# Patient Record
Sex: Female | Born: 1935 | Race: White | Hispanic: No | Marital: Married | Smoking: Former smoker
Health system: Southern US, Community
[De-identification: ages and names within clinical notes are randomized; demographics above are authoritative.]

## PROBLEM LIST (undated history)

## (undated) DIAGNOSIS — S72002A Fracture of unspecified part of neck of left femur, initial encounter for closed fracture: Secondary | ICD-10-CM

## (undated) DIAGNOSIS — K5790 Diverticulosis of intestine, part unspecified, without perforation or abscess without bleeding: Secondary | ICD-10-CM

## (undated) DIAGNOSIS — T4145XA Adverse effect of unspecified anesthetic, initial encounter: Secondary | ICD-10-CM

## (undated) DIAGNOSIS — N390 Urinary tract infection, site not specified: Secondary | ICD-10-CM

## (undated) DIAGNOSIS — S72141A Displaced intertrochanteric fracture of right femur, initial encounter for closed fracture: Secondary | ICD-10-CM

## (undated) DIAGNOSIS — M810 Age-related osteoporosis without current pathological fracture: Secondary | ICD-10-CM

## (undated) DIAGNOSIS — K5900 Constipation, unspecified: Secondary | ICD-10-CM

## (undated) DIAGNOSIS — K219 Gastro-esophageal reflux disease without esophagitis: Secondary | ICD-10-CM

## (undated) DIAGNOSIS — F0390 Unspecified dementia without behavioral disturbance: Secondary | ICD-10-CM

## (undated) DIAGNOSIS — M1990 Unspecified osteoarthritis, unspecified site: Secondary | ICD-10-CM

## (undated) DIAGNOSIS — Z4789 Encounter for other orthopedic aftercare: Secondary | ICD-10-CM

## (undated) DIAGNOSIS — G20 Parkinson's disease: Secondary | ICD-10-CM

## (undated) DIAGNOSIS — T8859XA Other complications of anesthesia, initial encounter: Secondary | ICD-10-CM

## (undated) DIAGNOSIS — F0280 Dementia in other diseases classified elsewhere without behavioral disturbance: Secondary | ICD-10-CM

## (undated) DIAGNOSIS — M25559 Pain in unspecified hip: Secondary | ICD-10-CM

## (undated) DIAGNOSIS — G3183 Dementia with Lewy bodies: Secondary | ICD-10-CM

## (undated) HISTORY — PX: ABDOMINAL HYSTERECTOMY: SHX81

## (undated) HISTORY — DX: Constipation, unspecified: K59.00

## (undated) HISTORY — PX: TUBAL LIGATION: SHX77

## (undated) HISTORY — DX: Age-related osteoporosis without current pathological fracture: M81.0

## (undated) HISTORY — DX: Dementia with Lewy bodies: G31.83

## (undated) HISTORY — DX: Displaced intertrochanteric fracture of right femur, initial encounter for closed fracture: S72.141A

## (undated) HISTORY — PX: OTHER SURGICAL HISTORY: SHX169

## (undated) HISTORY — DX: Dementia in other diseases classified elsewhere without behavioral disturbance: F02.80

## (undated) HISTORY — DX: Urinary tract infection, site not specified: N39.0

## (undated) HISTORY — PX: EYE SURGERY: SHX253

## (undated) HISTORY — DX: Parkinson's disease: G20

## (undated) HISTORY — DX: Unspecified dementia without behavioral disturbance: F03.90

## (undated) HISTORY — DX: Fracture of unspecified part of neck of left femur, initial encounter for closed fracture: S72.002A

## (undated) HISTORY — DX: Pain in unspecified hip: M25.559

## (undated) HISTORY — DX: Encounter for other orthopedic aftercare: Z47.89

---

## 1997-11-07 ENCOUNTER — Ambulatory Visit (HOSPITAL_COMMUNITY): Admission: RE | Admit: 1997-11-07 | Discharge: 1997-11-07 | Payer: Self-pay | Admitting: Family Medicine

## 1998-11-22 ENCOUNTER — Encounter: Payer: Self-pay | Admitting: Family Medicine

## 1998-11-22 ENCOUNTER — Ambulatory Visit (HOSPITAL_COMMUNITY): Admission: RE | Admit: 1998-11-22 | Discharge: 1998-11-22 | Payer: Self-pay | Admitting: Family Medicine

## 1999-03-27 ENCOUNTER — Ambulatory Visit (HOSPITAL_COMMUNITY): Admission: RE | Admit: 1999-03-27 | Discharge: 1999-03-27 | Payer: Self-pay | Admitting: *Deleted

## 1999-11-25 ENCOUNTER — Ambulatory Visit (HOSPITAL_COMMUNITY): Admission: RE | Admit: 1999-11-25 | Discharge: 1999-11-25 | Payer: Self-pay | Admitting: Family Medicine

## 1999-11-25 ENCOUNTER — Encounter: Payer: Self-pay | Admitting: Family Medicine

## 2000-05-05 ENCOUNTER — Encounter: Admission: RE | Admit: 2000-05-05 | Discharge: 2000-05-05 | Payer: Self-pay | Admitting: Family Medicine

## 2000-05-05 ENCOUNTER — Encounter: Payer: Self-pay | Admitting: Family Medicine

## 2000-05-17 ENCOUNTER — Encounter: Admission: RE | Admit: 2000-05-17 | Discharge: 2000-06-01 | Payer: Self-pay | Admitting: Family Medicine

## 2000-12-01 ENCOUNTER — Ambulatory Visit (HOSPITAL_COMMUNITY): Admission: RE | Admit: 2000-12-01 | Discharge: 2000-12-01 | Payer: Self-pay | Admitting: Family Medicine

## 2000-12-01 ENCOUNTER — Encounter: Payer: Self-pay | Admitting: Family Medicine

## 2001-12-07 ENCOUNTER — Ambulatory Visit (HOSPITAL_COMMUNITY): Admission: RE | Admit: 2001-12-07 | Discharge: 2001-12-07 | Payer: Self-pay | Admitting: Family Medicine

## 2001-12-07 ENCOUNTER — Encounter: Payer: Self-pay | Admitting: Family Medicine

## 2002-04-07 ENCOUNTER — Ambulatory Visit (HOSPITAL_COMMUNITY): Admission: RE | Admit: 2002-04-07 | Discharge: 2002-04-07 | Payer: Self-pay | Admitting: *Deleted

## 2002-07-13 ENCOUNTER — Encounter: Payer: Self-pay | Admitting: Family Medicine

## 2002-07-13 ENCOUNTER — Encounter: Admission: RE | Admit: 2002-07-13 | Discharge: 2002-07-13 | Payer: Self-pay | Admitting: Family Medicine

## 2002-12-13 ENCOUNTER — Ambulatory Visit (HOSPITAL_COMMUNITY): Admission: RE | Admit: 2002-12-13 | Discharge: 2002-12-13 | Payer: Self-pay | Admitting: Family Medicine

## 2002-12-13 ENCOUNTER — Encounter: Payer: Self-pay | Admitting: Family Medicine

## 2003-03-15 ENCOUNTER — Ambulatory Visit (HOSPITAL_COMMUNITY): Admission: RE | Admit: 2003-03-15 | Discharge: 2003-03-15 | Payer: Self-pay | Admitting: Family Medicine

## 2003-03-15 ENCOUNTER — Encounter: Payer: Self-pay | Admitting: Family Medicine

## 2003-11-20 ENCOUNTER — Emergency Department (HOSPITAL_COMMUNITY): Admission: EM | Admit: 2003-11-20 | Discharge: 2003-11-21 | Payer: Self-pay | Admitting: Emergency Medicine

## 2003-12-19 ENCOUNTER — Ambulatory Visit (HOSPITAL_COMMUNITY): Admission: RE | Admit: 2003-12-19 | Discharge: 2003-12-19 | Payer: Self-pay | Admitting: Family Medicine

## 2004-03-13 ENCOUNTER — Encounter: Admission: RE | Admit: 2004-03-13 | Discharge: 2004-03-13 | Payer: Self-pay | Admitting: Family Medicine

## 2004-12-31 ENCOUNTER — Ambulatory Visit (HOSPITAL_COMMUNITY): Admission: RE | Admit: 2004-12-31 | Discharge: 2004-12-31 | Payer: Self-pay | Admitting: Family Medicine

## 2005-05-27 ENCOUNTER — Encounter: Admission: RE | Admit: 2005-05-27 | Discharge: 2005-05-27 | Payer: Self-pay | Admitting: Family Medicine

## 2006-01-13 ENCOUNTER — Ambulatory Visit (HOSPITAL_COMMUNITY): Admission: RE | Admit: 2006-01-13 | Discharge: 2006-01-13 | Payer: Self-pay | Admitting: Family Medicine

## 2006-01-21 ENCOUNTER — Encounter: Admission: RE | Admit: 2006-01-21 | Discharge: 2006-01-21 | Payer: Self-pay | Admitting: Family Medicine

## 2007-01-11 ENCOUNTER — Ambulatory Visit (HOSPITAL_COMMUNITY): Admission: RE | Admit: 2007-01-11 | Discharge: 2007-01-12 | Payer: Self-pay | Admitting: Obstetrics and Gynecology

## 2007-02-01 ENCOUNTER — Ambulatory Visit (HOSPITAL_COMMUNITY): Admission: RE | Admit: 2007-02-01 | Discharge: 2007-02-01 | Payer: Self-pay | Admitting: Family Medicine

## 2008-02-07 ENCOUNTER — Ambulatory Visit (HOSPITAL_COMMUNITY): Admission: RE | Admit: 2008-02-07 | Discharge: 2008-02-07 | Payer: Self-pay | Admitting: Family Medicine

## 2008-02-10 ENCOUNTER — Encounter: Admission: RE | Admit: 2008-02-10 | Discharge: 2008-02-10 | Payer: Self-pay | Admitting: Neurology

## 2009-02-07 ENCOUNTER — Ambulatory Visit (HOSPITAL_COMMUNITY): Admission: RE | Admit: 2009-02-07 | Discharge: 2009-02-07 | Payer: Self-pay | Admitting: Family Medicine

## 2010-01-01 ENCOUNTER — Encounter: Admission: RE | Admit: 2010-01-01 | Discharge: 2010-01-01 | Payer: Self-pay | Admitting: Dermatology

## 2010-01-16 ENCOUNTER — Encounter: Admission: RE | Admit: 2010-01-16 | Discharge: 2010-01-16 | Payer: Self-pay | Admitting: Dermatology

## 2010-02-26 ENCOUNTER — Ambulatory Visit (HOSPITAL_COMMUNITY): Admission: RE | Admit: 2010-02-26 | Discharge: 2010-02-26 | Payer: Self-pay | Admitting: General Surgery

## 2010-02-28 ENCOUNTER — Ambulatory Visit: Payer: Self-pay | Admitting: Oncology

## 2010-03-12 ENCOUNTER — Ambulatory Visit: Admission: RE | Admit: 2010-03-12 | Discharge: 2010-03-28 | Payer: Self-pay | Admitting: Radiation Oncology

## 2010-09-18 LAB — COMPREHENSIVE METABOLIC PANEL
ALT: 18 U/L (ref 0–35)
AST: 27 U/L (ref 0–37)
Albumin: 3.8 g/dL (ref 3.5–5.2)
BUN: 18 mg/dL (ref 6–23)
Chloride: 106 mEq/L (ref 96–112)
GFR calc Af Amer: >60 mL/min (ref >60)
GFR calc non Af Amer: >60 mL/min (ref >60)
Potassium: 4.6 mEq/L (ref 3.5–5.1)
Total Bilirubin: 0.4 mg/dL (ref 0.3–1.2)
Total Protein: 6.8 g/dL (ref 6.0–8.3)

## 2010-09-18 LAB — DIFFERENTIAL
Basophils Relative: 0 % (ref 0–1)
Eosinophils Absolute: 0.2 10*3/uL (ref 0.0–0.7)
Eosinophils Relative: 3 % (ref 0–5)
Lymphocytes Relative: 36 % (ref 12–46)
Lymphs Abs: 2.5 10*3/uL (ref 0.7–4.0)
Monocytes Absolute: 0.4 10*3/uL (ref 0.1–1.0)

## 2010-09-18 LAB — CBC
Hemoglobin: 14.7 g/dL (ref 12.0–15.0)
MCHC: 34.5 g/dL (ref 30.0–36.0)
Platelets: 200 10*3/uL (ref 150–400)

## 2010-09-18 LAB — SURGICAL PCR SCREEN: Staphylococcus aureus: NEGATIVE

## 2010-10-24 ENCOUNTER — Other Ambulatory Visit (HOSPITAL_COMMUNITY): Payer: Self-pay | Admitting: General Surgery

## 2010-10-24 DIAGNOSIS — Z0000 Encounter for general adult medical examination without abnormal findings: Secondary | ICD-10-CM

## 2010-11-18 NOTE — Op Note (Signed)
NAMEBEAU, Deborah Hunt                ACCOUNT NO.:  [id number]   MEDICAL RECORD NO.:  [id number]          PATIENT TYPE:  AMB   LOCATION:  SDC                           FACILITY:  [facility]   PHYSICIAN:  Randye Lobo, M.D.   DATE OF BIRTH:  11/21/1926   DATE OF PROCEDURE:  01/11/2007  DATE OF DISCHARGE:                               OPERATIVE REPORT   PREOPERATIVE DIAGNOSIS:  Third degree rectocele.   POSTOPERATIVE DIAGNOSIS:  Third degree rectocele.   PROCEDURES:  A posterior colporrhaphy.   SURGEON:  Conley Simmonds, M.D.   ASSISTANT:  Lodema Hong, M.D.   ANESTHESIA:  General endotracheal, local with 0.25% Marcaine with  epinephrine 1:100,000.   IV FLUIDS:  1000 mL Ringer's lactate.   ESTIMATED BLOOD LOSS:  Minimal.   URINE OUTPUT:  100 mL   COMPLICATIONS:  None.   INDICATIONS FOR PROCEDURE:  The patient is a 77 year old gravida 2, para  2 Caucasian female status post total abdominal hysterectomy with  bilateral salpingo-oophorectomy, who was referred by Dr. Manus Gunning for  evaluation and treatment of the rectocele.  The patient is reporting  constipation symptoms.  She does not require perineal splinting or  vaginal digital pressure for bowel movements.  She reports discomfort  secondary to the rectocele.  She denies any history of urinary  incontinence with stressful maneuvers.  She does have a history of urge  incontinence and microscopic hematuria which has been evaluated by Dr.  Barron Alvine and this documented a possible small renal stone, but  otherwise negative bladder cytology and a negative cystoscopy.   The patient presents now for surgical treatment of the third degree  rectocele after risks, benefits, and alternatives are reviewed.   FINDINGS:  Exam under anesthesia revealed a third-degree high rectocele.  There is good anterior vaginal wall and good vaginal apical support.  The cervix and uterus were noted to be absent.  No midline or adnexal  masses were  appreciated.   PROCEDURE:  The patient is reidentified in the preoperative hold area.  She did receive Ancef 1 gram IV for antibiotic prophylaxis.  She  received both TED hose and PAS stockings for DVT prophylaxis.   In the operating room, general endotracheal anesthesia was induced and  the patient was then placed in the dorsal lithotomy position.  The lower  abdomen and vagina and perineum were sterilely prepped and draped.  The  Foley catheter was sterilely placed inside the bladder.   An exam under anesthesia was performed and the findings are as noted  above.   Allis clamps were used to mark the perineal body in the posterior  vaginal wall in the midline.  The vaginal mucosa and perineal body was  then injected with quarter percent Marcaine with 1:200,000 of  epinephrine.  A triangular wedge of epithelium was excised from the  perineal body and the posterior vaginal wall was incised vertically with  a Metzenbaum scissors.  The perirectal fascia was dissected off of the  overlying vaginal epithelium bilaterally all the way to the level of the  vaginal apex.  A digital  rectal examination was performed at this time  and the area above the rectocele was explored for an enterocele.  There  was no enterocele sac appreciated.   The posterior colporrhaphy was performed with a combination of 2-0  Vicryl and 0 Vicryl sutures.  The top suture of the posterior  colporrhaphy was a pursestring suture of 2-0 Vicryl which was performed  while a gloved digit remained in the rectum.  Reduction of the rectocele  was then performed by placing a vertical mattress sutures of 2-0 Vicryl  which transitioned down to 0 Vicryl at the level of the perineal body.  An additional mattress suture of 2-0 Vicryl was placed at the very top  of the posterior colporrhaphy in order to imbricate the pursestring  suture and provide additional support at the vaginal apex.  Excess  vaginal mucosa was then trimmed and  the posterior vaginal mucosa was  closed with a running locked suture of 2-0 Vicryl which continued along  the perineal body in a running fashion and then to close the perineum  with a subcuticular closure, as for an episiotomy.   A vaginal packing with Estrace cream was placed in the vagina.  The  Foley catheter was left to gravity drainage.  Final rectal exam  confirmed the absence of sutures in the rectum.   This concluded the patient's procedure.  There were no complications.  All needle, instrument, sponge counts were correct.      Randye Lobo, M.D.  Electronically Signed     BES/MEDQ  D:  01/11/2007  T:  01/11/2007  Job:  [id number]   cc:   Bryan Lemma. Manus Gunning, M.D.  Fax: [phone]

## 2011-02-09 ENCOUNTER — Other Ambulatory Visit (INDEPENDENT_AMBULATORY_CARE_PROVIDER_SITE_OTHER): Payer: Self-pay | Admitting: General Surgery

## 2011-02-09 ENCOUNTER — Ambulatory Visit (HOSPITAL_COMMUNITY)
Admission: RE | Admit: 2011-02-09 | Discharge: 2011-02-09 | Disposition: A | Discharge status: Home / Self Care | Payer: Medicare Other | Source: Ambulatory Visit | Attending: General Surgery | Admitting: General Surgery

## 2011-02-09 DIAGNOSIS — Z1231 Encounter for screening mammogram for malignant neoplasm of breast: Secondary | ICD-10-CM

## 2011-02-09 DIAGNOSIS — Z0000 Encounter for general adult medical examination without abnormal findings: Secondary | ICD-10-CM

## 2011-02-13 ENCOUNTER — Ambulatory Visit
Admission: RE | Admit: 2011-02-13 | Discharge: 2011-02-13 | Disposition: A | Discharge status: Home / Self Care | Payer: Medicare Other | Source: Ambulatory Visit | Attending: General Surgery | Admitting: General Surgery

## 2011-02-13 DIAGNOSIS — Z1231 Encounter for screening mammogram for malignant neoplasm of breast: Secondary | ICD-10-CM

## 2011-04-21 LAB — CBC
Hemoglobin: 11.9 — ABNORMAL LOW
MCHC: 33.4
MCHC: 34.7
MCV: 91.3
Platelets: 233
RBC: 3.78 — ABNORMAL LOW
RBC: 4.59
RDW: 13
WBC: 12.1 — ABNORMAL HIGH

## 2011-04-21 LAB — URINE MICROSCOPIC-ADD ON

## 2011-04-21 LAB — URINALYSIS, ROUTINE W REFLEX MICROSCOPIC
Bilirubin Urine: NEGATIVE
Glucose, UA: NEGATIVE
Ketones, ur: NEGATIVE
Nitrite: NEGATIVE
Specific Gravity, Urine: 1.02
pH: 6.5

## 2011-04-21 LAB — COMPREHENSIVE METABOLIC PANEL
ALT: 14
AST: 25
Calcium: 9.4
Creatinine, Ser: 0.64
GFR calc Af Amer: >60
GFR calc non Af Amer: >60
Sodium: 141
Total Protein: 6.9

## 2011-04-21 LAB — BASIC METABOLIC PANEL
CO2: 28
Calcium: 8.3 — ABNORMAL LOW
Chloride: 108
Creatinine, Ser: 0.64
GFR calc Af Amer: >60
Sodium: 140

## 2011-07-07 DIAGNOSIS — F0280 Dementia in other diseases classified elsewhere without behavioral disturbance: Secondary | ICD-10-CM

## 2011-07-07 DIAGNOSIS — K5900 Constipation, unspecified: Secondary | ICD-10-CM

## 2011-07-07 HISTORY — DX: Constipation, unspecified: K59.00

## 2011-07-07 HISTORY — DX: Dementia in other diseases classified elsewhere, unspecified severity, without behavioral disturbance, psychotic disturbance, mood disturbance, and anxiety: F02.80

## 2012-02-11 ENCOUNTER — Ambulatory Visit (HOSPITAL_COMMUNITY)
Admission: RE | Admit: 2012-02-11 | Discharge: 2012-02-11 | Disposition: A | Discharge status: Home / Self Care | Payer: Medicare Other | Source: Ambulatory Visit | Attending: Family Medicine | Admitting: Family Medicine

## 2012-02-11 DIAGNOSIS — R1311 Dysphagia, oral phase: Secondary | ICD-10-CM | POA: Insufficient documentation

## 2012-02-11 DIAGNOSIS — K219 Gastro-esophageal reflux disease without esophagitis: Secondary | ICD-10-CM

## 2012-02-11 DIAGNOSIS — R531 Weakness: Secondary | ICD-10-CM

## 2012-02-11 DIAGNOSIS — R5383 Other fatigue: Secondary | ICD-10-CM | POA: Insufficient documentation

## 2012-02-11 DIAGNOSIS — R5381 Other malaise: Secondary | ICD-10-CM | POA: Insufficient documentation

## 2012-02-11 NOTE — Procedures (Signed)
Objective Swallowing Evaluation: Modified Barium Swallowing Study  Patient Details  Name: Deborah Hunt MRN: [id number] Date of Birth: 1926-07-14  Today's Date: 02/11/2012 Time: 1135-1210 SLP Time Calculation (min): 35 min  Past Medical History: No past medical history on file. Past Surgical History: No past surgical history on file. HPI:  78 yr old seen for outpatient MBS accompanied by husband.  Pt.'s husband reported pt. coughing frequently with water, sometimes with pills, not frequently during meals.  No recent pna or acute health problems although he reports a steady decline in mobility and energy level.  HPMH:  cervical spondylosis at C4-5, C5-6.  Pt./spouse deny esophageal impairments.      Assessment / Plan / Recommendation Clinical Impression  Dysphagia Diagnosis: Mild oral phase dysphagia Clinical impression: Pt. exhibited mild oral dysphagia characterized by decreased oral control and coordination with attempts to transit barium pill with thin barium resulting in premature spill of barium into laryngeal vestibule with strong cough.  Swallow initiation with consistencies was WFL's as well as tongue base retraction and laryngeal elevation.  Pt. would be at higher aspiration risk with foods that have varying textures (fruit cocktail, vegetable soup etc) or pills with thin liquid.  Recommend regular texture (moist meats, avoid dry and crumbly foods) and thin liquids, pills with puree texture.      Treatment Recommendation       Diet Recommendation Regular;Thin liquid   Liquid Administration via: Cup;No straw Medication Administration: Whole meds with puree Supervision: Full supervision/cueing for compensatory strategies;Patient able to self feed Compensations: Slow rate;Small sips/bites Postural Changes and/or Swallow Maneuvers: Seated upright 90 degrees    Other  Recommendations Oral Care Recommendations: Oral care BID   Follow Up Recommendations  None    Frequency and  Duration                 Reason for Referral Objectively evaluate swallowing function   Oral Phase     Pharyngeal Phase Pharyngeal Phase: Impaired   Cervical Esophageal Phase Cervical Esophageal Phase: [facility]    Darrow Bussing.Ed [employer] [phone]  02/11/2012

## 2012-07-06 DIAGNOSIS — N390 Urinary tract infection, site not specified: Secondary | ICD-10-CM

## 2012-07-06 HISTORY — DX: Urinary tract infection, site not specified: N39.0

## 2012-08-19 ENCOUNTER — Emergency Department (HOSPITAL_COMMUNITY)
Admission: EM | Admit: 2012-08-19 | Discharge: 2012-08-19 | Disposition: A | Discharge status: Home / Self Care | Payer: Medicare Other | Attending: Emergency Medicine | Admitting: Emergency Medicine

## 2012-08-19 DIAGNOSIS — Z79899 Other long term (current) drug therapy: Secondary | ICD-10-CM | POA: Insufficient documentation

## 2012-08-19 DIAGNOSIS — R627 Adult failure to thrive: Secondary | ICD-10-CM | POA: Insufficient documentation

## 2012-08-19 DIAGNOSIS — K117 Disturbances of salivary secretion: Secondary | ICD-10-CM | POA: Insufficient documentation

## 2012-08-19 DIAGNOSIS — R630 Anorexia: Secondary | ICD-10-CM | POA: Insufficient documentation

## 2012-08-19 DIAGNOSIS — R634 Abnormal weight loss: Secondary | ICD-10-CM | POA: Insufficient documentation

## 2012-08-19 DIAGNOSIS — R110 Nausea: Secondary | ICD-10-CM | POA: Insufficient documentation

## 2012-08-19 DIAGNOSIS — R5381 Other malaise: Secondary | ICD-10-CM | POA: Insufficient documentation

## 2012-08-19 DIAGNOSIS — R5383 Other fatigue: Secondary | ICD-10-CM | POA: Insufficient documentation

## 2012-08-19 DIAGNOSIS — IMO0002 Reserved for concepts with insufficient information to code with codable children: Secondary | ICD-10-CM

## 2012-08-19 DIAGNOSIS — R531 Weakness: Secondary | ICD-10-CM

## 2012-08-19 LAB — CBC WITH DIFFERENTIAL/PLATELET
Basophils Absolute: 0 10*3/uL (ref 0.0–0.1)
Basophils Relative: 0 % (ref 0–1)
Eosinophils Absolute: 0.1 10*3/uL (ref 0.0–0.7)
MCH: 31.4 pg (ref 26.0–34.0)
MCHC: 34.6 g/dL (ref 30.0–36.0)
Monocytes Relative: 7 % (ref 3–12)
Neutro Abs: 6.5 10*3/uL (ref 1.7–7.7)
Neutrophils Relative %: 71 % (ref 43–77)
Platelets: 221 10*3/uL (ref 150–400)
RDW: 12.6 % (ref 11.5–15.5)

## 2012-08-19 LAB — URINALYSIS, ROUTINE W REFLEX MICROSCOPIC
Glucose, UA: NEGATIVE mg/dL
Hgb urine dipstick: NEGATIVE
Leukocytes, UA: NEGATIVE
pH: 8 (ref 5.0–8.0)

## 2012-08-19 LAB — URINE MICROSCOPIC-ADD ON

## 2012-08-19 LAB — COMPREHENSIVE METABOLIC PANEL
AST: 17 U/L (ref 0–37)
Albumin: 4.1 g/dL (ref 3.5–5.2)
Alkaline Phosphatase: 95 U/L (ref 39–117)
BUN: 13 mg/dL (ref 6–23)
Potassium: 3.5 mEq/L (ref 3.5–5.1)
Total Protein: 7.4 g/dL (ref 6.0–8.3)

## 2012-08-19 MED ORDER — CARBIDOPA-LEVODOPA CR 25-100 MG PO TBCR
1.0000 | EXTENDED_RELEASE_TABLET | Freq: Once | ORAL | Status: AC
  Administered 2012-08-19: 1 via ORAL
  Filled 2012-08-19: qty 1

## 2012-08-19 MED ORDER — SODIUM CHLORIDE 0.9 % IV BOLUS (SEPSIS): 500.0000 mL | Freq: Once | INTRAVENOUS | Status: DC

## 2012-08-19 NOTE — ED Notes (Signed)
LKG:MW10<UV> Expected date:08/19/12<BR> Expected time:10:40 AM<BR> Means of arrival:<BR> Comments:<BR> 85yoF- AMS

## 2012-08-19 NOTE — ED Notes (Signed)
Per EMS: Pt is from [facility] [place]. Husband is at bedside. Husband reporting decline in mental status from baseline. EMS reports that pt has been A/O, however has moments of looking off into the distance, being scared, and emotional.

## 2012-08-19 NOTE — ED Provider Notes (Signed)
History  This chart was scribed for Loren Racer, MD by Bennett Scrape, ED Scribe. This patient was seen in room WA07/WA07 and the patient's care was started at 11:13 AM.  CSN: [id number]  Arrival date & time 08/19/12  1039   First MD Initiated Contact with Patient 08/19/12 1113      Chief Complaint  Patient presents with  . Altered Mental Status     Patient is a 79 y.o. female presenting with altered mental status. The history is provided by the patient. No language interpreter was used.  Altered Mental Status This is a recurrent problem. The current episode started more than 1 week ago. The problem occurs constantly. The problem has been gradually worsening. Pertinent negatives include no chest pain and no abdominal pain. Nothing aggravates the symptoms. Nothing relieves the symptoms. She has tried nothing for the symptoms.    Deborah Hunt is a 79 y.o. female brought in by ambulance from [facility], who presents to the Emergency Department complaining of 6 months of AMS described as weakness and drinking less than normal. Husband states that he has to help the pt ambulate more than usual and also reports that the pt has been eating and drinking less than normal. He states that she has lost 20 lbs over the past 8 months. Pt is alert and oriented currently. She reports occasional nausea and a dry mouth but denies urinary symptoms, abdominal pain and CP as associated symptoms.   No past medical history on file.  No past surgical history on file.  No family history on file.  History  Substance Use Topics  . Smoking status: Not on file  . Smokeless tobacco: Not on file  . Alcohol Use: Not on file    OB History   No data available      Review of Systems  Constitutional: Positive for appetite change. Negative for fever and chills.  Cardiovascular: Negative for chest pain.  Gastrointestinal: Positive for nausea. Negative for vomiting and abdominal pain.   Psychiatric/Behavioral: Positive for altered mental status. Negative for confusion.  All other systems reviewed and are negative.    Allergies  Review of patient's allergies indicates no known allergies.  Home Medications   Current Outpatient Rx  Name  Route  Sig  Dispense  Refill  . carbidopa-levodopa (SINEMET IR) 25-100 MG per tablet   Oral   Take 0.5-1.5 tablets by mouth 3 (three) times daily.         . citalopram (CELEXA) 10 MG tablet   Oral   Take 10 mg by mouth daily.         Marland Kitchen donepezil (ARICEPT) 10 MG tablet   Oral   Take 10 mg by mouth at bedtime.           Triage Vitals: BP 162/55  Pulse 67  Temp(Src) 97.5 F (36.4 C) (Oral)  Resp 18  SpO2 100%  Physical Exam  Nursing note and vitals reviewed. Constitutional: She is oriented to person, place, and time. She appears cachectic. No distress.  HENT:  Head: Normocephalic and atraumatic.  Eyes: Conjunctivae and EOM are normal. Pupils are equal, round, and reactive to light.  Neck: Normal range of motion. Neck supple. No tracheal deviation present.  Cardiovascular: Normal rate and regular rhythm.   No murmur heard. Pulmonary/Chest: Effort normal and breath sounds normal. No respiratory distress.  Abdominal: Soft. There is no tenderness.  Musculoskeletal: Normal range of motion.  Neurological: She is alert and oriented to person,  place, and time.  Following commands, moves all extremities    Skin: Skin is warm and dry.  Psychiatric: She has a normal mood and affect. Her behavior is normal.    ED Course  Procedures (including critical care time)  DIAGNOSTIC STUDIES: Oxygen Saturation is 100% on room air, normal by my interpretation.    COORDINATION OF CARE: 11:50 AM-Discussed treatment plan which includes UA with pt and husband at bedside and both agreed to plan.   1:51 PM- Husband changed his story and states that the only time the pt needs help is getting in and out of the tub. He denies that she  needs help ambulating otherwise.  2:44 PM-Pt rechecked and is resting comfortably. Husband states that his only concern is that the pt is eating less and he reports that he is comfortable taking care of the pt. Will f/u with pt's PCP.   Labs Reviewed  URINALYSIS, ROUTINE W REFLEX MICROSCOPIC - Abnormal; Notable for the following:    APPearance TURBID (*)    Ketones, ur 15 (*)    All other components within normal limits  URINE MICROSCOPIC-ADD ON - Abnormal; Notable for the following:    Bacteria, UA FEW (*)    All other components within normal limits  COMPREHENSIVE METABOLIC PANEL - Abnormal; Notable for the following:    GFR calc non Af Amer 82 (*)    All other components within normal limits  CBC WITH DIFFERENTIAL   No results found.   1. Failure to thrive   2. Generalized weakness       MDM  I personally performed the services described in this documentation, which was scribed in my presence. The recorded information has been reviewed and is accurate.    Loren Racer, MD 08/19/12 [phone]

## 2012-09-03 DIAGNOSIS — S72002A Fracture of unspecified part of neck of left femur, initial encounter for closed fracture: Secondary | ICD-10-CM

## 2012-09-03 HISTORY — DX: Fracture of unspecified part of neck of left femur, initial encounter for closed fracture: S72.002A

## 2012-09-13 ENCOUNTER — Inpatient Hospital Stay (HOSPITAL_COMMUNITY)
Admission: EM | Admit: 2012-09-13 | Discharge: 2012-09-16 | DRG: 482 | Disposition: A | Discharge status: Skilled Nursing Facility | Payer: Medicare Other | Attending: Internal Medicine | Admitting: Internal Medicine

## 2012-09-13 ENCOUNTER — Emergency Department (HOSPITAL_COMMUNITY): Payer: Medicare Other

## 2012-09-13 ENCOUNTER — Encounter (HOSPITAL_COMMUNITY): Payer: Self-pay | Admitting: Emergency Medicine

## 2012-09-13 DIAGNOSIS — S72033A Displaced midcervical fracture of unspecified femur, initial encounter for closed fracture: Principal | ICD-10-CM | POA: Diagnosis present

## 2012-09-13 DIAGNOSIS — M810 Age-related osteoporosis without current pathological fracture: Secondary | ICD-10-CM | POA: Diagnosis present

## 2012-09-13 DIAGNOSIS — K219 Gastro-esophageal reflux disease without esophagitis: Secondary | ICD-10-CM | POA: Diagnosis present

## 2012-09-13 DIAGNOSIS — Y9210 Unspecified residential institution as the place of occurrence of the external cause: Secondary | ICD-10-CM | POA: Diagnosis present

## 2012-09-13 DIAGNOSIS — F0280 Dementia in other diseases classified elsewhere without behavioral disturbance: Secondary | ICD-10-CM | POA: Diagnosis present

## 2012-09-13 DIAGNOSIS — W010XXA Fall on same level from slipping, tripping and stumbling without subsequent striking against object, initial encounter: Secondary | ICD-10-CM | POA: Diagnosis present

## 2012-09-13 DIAGNOSIS — D72829 Elevated white blood cell count, unspecified: Secondary | ICD-10-CM | POA: Diagnosis present

## 2012-09-13 DIAGNOSIS — Z66 Do not resuscitate: Secondary | ICD-10-CM | POA: Diagnosis present

## 2012-09-13 DIAGNOSIS — S72002A Fracture of unspecified part of neck of left femur, initial encounter for closed fracture: Secondary | ICD-10-CM

## 2012-09-13 DIAGNOSIS — Z79899 Other long term (current) drug therapy: Secondary | ICD-10-CM

## 2012-09-13 HISTORY — DX: Adverse effect of unspecified anesthetic, initial encounter: T41.45XA

## 2012-09-13 HISTORY — DX: Gastro-esophageal reflux disease without esophagitis: K21.9

## 2012-09-13 HISTORY — DX: Unspecified osteoarthritis, unspecified site: M19.90

## 2012-09-13 HISTORY — DX: Age-related osteoporosis without current pathological fracture: M81.0

## 2012-09-13 HISTORY — DX: Diverticulosis of intestine, part unspecified, without perforation or abscess without bleeding: K57.90

## 2012-09-13 HISTORY — DX: Other complications of anesthesia, initial encounter: T88.59XA

## 2012-09-13 LAB — CBC WITH DIFFERENTIAL/PLATELET
Basophils Relative: 0 % (ref 0–1)
HCT: 37.4 % (ref 36.0–46.0)
Hemoglobin: 13.3 g/dL (ref 12.0–15.0)
Lymphocytes Relative: 9 % — ABNORMAL LOW (ref 12–46)
Lymphs Abs: 1.3 10*3/uL (ref 0.7–4.0)
MCHC: 35.6 g/dL (ref 30.0–36.0)
Monocytes Absolute: 0.7 10*3/uL (ref 0.1–1.0)
Monocytes Relative: 5 % (ref 3–12)
Neutro Abs: 12.2 10*3/uL — ABNORMAL HIGH (ref 1.7–7.7)
RBC: 4.23 MIL/uL (ref 3.87–5.11)

## 2012-09-13 LAB — BASIC METABOLIC PANEL
BUN: 10 mg/dL (ref 6–23)
CO2: 19 mEq/L (ref 19–32)
Chloride: 105 mEq/L (ref 96–112)
Creatinine, Ser: 0.51 mg/dL (ref 0.50–1.10)
Glucose, Bld: 89 mg/dL (ref 70–99)

## 2012-09-13 MED ORDER — SODIUM CHLORIDE 0.9 % IV SOLN
INTRAVENOUS | Status: DC
  Administered 2012-09-14: 20:00:00 via INTRAVENOUS

## 2012-09-13 MED ORDER — MORPHINE SULFATE 2 MG/ML IJ SOLN
1.0000 mg | INTRAMUSCULAR | Status: DC | PRN
  Administered 2012-09-13 – 2012-09-14 (×2): 1 mg via INTRAVENOUS
  Filled 2012-09-13 (×2): qty 1

## 2012-09-13 MED ORDER — MORPHINE SULFATE 4 MG/ML IJ SOLN
4.0000 mg | Freq: Once | INTRAMUSCULAR | Status: AC
  Administered 2012-09-13: 4 mg via INTRAVENOUS
  Filled 2012-09-13: qty 1

## 2012-09-13 NOTE — H&P (Signed)
History and Physical  Deborah Hunt WJX:[id number] DOB: [birth year] DOA: 09/13/2012  Referring physician: Lorre Nick, MD PCP: Bobbie Stack, MD  Blair Heys, MD  Chief Complaint: Fall  HPI:  79 year old woman presented with history of fall and left hip pain. Imaging confirmed left hip fracture.  History obtained from husband at bedside. Patient has dementia and cannot provide reliable history. They both live at [facility]. The husband was in the bathroom and heard a fall, he turned around and saw his wife on the floor. She did not hit her head or pass out. Last fall 2/14 on Valentine's Day. She has no history of cardiac disease or chest pain per husband. The pain is severe and occurs with any manipulation of the hip. When she ambulates she does balance using furniture in the room and mechanism of fall is presumed to be mechanical.  In ED  Afebrile, VSS  WBC 14.3, BMP unremarkable  Imaging--CXR, lumbar films negative. CT hip--Acute left subcapital femoral fracture.  Dr. Antony Odea contacted by EDP. Surgery planned 3/12.  Review of Systems:  Negative for fever, visual changes, sore throat, rash, chest pain, SOB, dysuria, bleeding, n/v/abdominal pain.  Past Medical History  Diagnosis Date  . Dementia   . GERD (gastroesophageal reflux disease)   . Diverticulosis   . Arthritis   . Osteoporosis   . Complication of anesthesia     caused brain disease    Past Surgical History  Procedure Laterality Date  . Tubal ligation    . Nipple removal      from left breast  . Abdominal hysterectomy    . Eye surgery      bilateral cataract surgery    Social History:  has no tobacco, alcohol, and drug history on file.  No Known Allergies  No family history on file. No particular medical problems reported.  Prior to Admission medications   Medication Sig Start Date End Date Taking? Authorizing Provider  alendronate (FOSAMAX) 70 MG tablet Take 70 mg by mouth every 7 (seven) days. Take  with a full glass of water on an empty stomach. Monday   Yes Historical Provider, MD  carbidopa-levodopa (SINEMET IR) 25-100 MG per tablet Take 0.5-1.5 tablets by mouth 3 (three) times daily. Pt takes 1.5 tablets with meals. At 3p pt's caregiver gives her 1/2 tab -1 tablet depending on severity of tremors.   Yes Historical Provider, MD  citalopram (CELEXA) 20 MG tablet Take 20 mg by mouth daily.   Yes Historical Provider, MD  donepezil (ARICEPT) 10 MG tablet Take 10 mg by mouth at bedtime.   Yes Historical Provider, MD  meloxicam (MOBIC) 15 MG tablet Take 15 mg by mouth as needed for pain.   Yes Historical Provider, MD  Vitamin D, Ergocalciferol, (DRISDOL) 50000 UNITS CAPS Take 50,000 Units by mouth every 7 (seven) days. Monday   Yes Historical Provider, MD   Physical Exam: Filed Vitals:   09/13/12 1328 09/13/12 1342 09/13/12 1849  BP:  155/62 139/65  Pulse:  65 84  Temp:  97.6 F (36.4 C) 99.1 F (37.3 C)  TempSrc:  Oral Oral  Resp:  18 18  SpO2: 98% 96% 92%   General:  Appears calm and comfortable Eyes: PERRL, normal lids, irises  ENT: grossly normal hearing, lips & tongue Neck: no LAD, masses or thyromegaly Cardiovascular: RRR, no m/r/g. No LE edema. Respiratory: CTA bilaterally, no w/r/r. Normal respiratory effort. Abdomen: soft, ntnd Skin: no rash, bruising or induration seen Musculoskeletal: grossly normal tone  BUE/BLE. Pain over left hip. Psychiatric: Pleasantly confused. grossly normal mood and affect, speech fluent but inappropriate at times. Neurologic: grossly non-focal.  Wt Readings from Last 3 Encounters:  No data found for Wt    Labs on Admission:  Basic Metabolic Panel:  Recent Labs Lab 09/13/12 1924  NA 136  K 3.9  CL 105  CO2 19  GLUCOSE 89  BUN 10  CREATININE 0.51  CALCIUM 8.5    CBC:  Recent Labs Lab 09/13/12 1924  WBC 14.3*  NEUTROABS 12.2*  HGB 13.3  HCT 37.4  MCV 88.4  PLT 203    Radiological Exams on Admission: Dg Chest 2  View  09/13/2012  *RADIOLOGY REPORT*  Clinical Data: Fall  CHEST - 2 VIEW  Comparison: None.  Findings: Pleuroparenchymal changes right greater than left at the lung apices are stable.  Hyperaeration, bronchitic changes, interstitial prominence are stable.  Cardiomegaly is stable.  The aortic knob is not significantly changed.  Atherosclerotic calcifications are again noted.  No pneumothorax.  No obvious acute rib fracture.  IMPRESSION: Chronic changes.  No active cardiopulmonary disease.   Original Report Authenticated By: Jolaine Click, M.D.    Dg Lumbar Spine Complete  09/13/2012  *RADIOLOGY REPORT*  Clinical Data: Fall  LUMBAR SPINE - COMPLETE 4+ VIEW  Comparison: None.  Findings: This study is limited by suboptimal technique.  No obvious acute compression deformity.  Osteopenia.  Stable alignment.  Stable appearance of the discs.  IMPRESSION: Limited exam.  No obvious acute lumbar vertebral compression fracture.   Original Report Authenticated By: Jolaine Click, M.D.    Dg Pelvis 1-2 Views  09/13/2012  *RADIOLOGY REPORT*  Clinical Data: Fall  PELVIS - 1-2 VIEW  Comparison: None.  Findings: Severe osteopenia.   The left femoral head and neck have an abnormal configuration.  Impaction fracture  of the femoral neck into the femoral head is not excluded.  IMPRESSION: Findings are worrisome for a subcapital impaction fracture of the left femoral neck.  CT or MR is recommended to further characterize peri   Original Report Authenticated By: Jolaine Click, M.D.    Dg Hip Complete Left  09/13/2012  *RADIOLOGY REPORT*  Clinical Data: Fall  LEFT HIP - COMPLETE 2+ VIEW  Comparison: None.  Findings: There is suspected impaction of the left femoral neck into the left femoral head.  Osteopenia.  Femur is otherwise intact.  IMPRESSION: Findings are worrisome for a subcapital impaction fracture of the left femoral neck.  CT or MR is recommended to confirm.   Original Report Authenticated By: Jolaine Click, M.D.    Ct Hip  Left Wo Contrast  09/13/2012  *RADIOLOGY REPORT*  Clinical Data: Fall, left hip pain, possible abnormality of prior images today  CT OF THE LEFT HIP WITHOUT CONTRAST  Technique:  Multidetector CT imaging was performed according to the standard protocol. Multiplanar CT image reconstructions were also generated.  Comparison: Left hip radiographs same date  Findings: There is an acute subcapital left femoral fracture. Femoral head is properly located with respect to the acetabulum. Bones are osteopenic.  Moderate stool volume noted within visualized portions of nondilated colon.  Bladder appears normal.  IMPRESSION: Acute left subcapital femoral fracture.   Original Report Authenticated By: Christiana Pellant, M.D.     EKG: Pending.   Principal Problem:   Hip fracture, left Active Problems:   Dementia with Lewy bodies   Assessment/Plan 1. Acute left subcapital femoral fracture s/p fall: No history of cardiac disease or chest pain.  Main risk factor is advanced age. Clear for surgery with known risks. 2. Lewy body dementia: Appears stable. Continue Sinemet for tremors.    Code Status: DO NOT RESUSCITATE Family Communication: Discussed with husband at bedside Disposition Plan/Anticipated LOS: Admit, 3 days.  Time spent: 50 minutes  Brendia Sacks, MD  [facility] Pager [phone] 09/13/2012, 8:03 PM

## 2012-09-13 NOTE — ED Notes (Signed)
Per EMS: Pt was at independent living facility, walking to the BR, and pt fell on lt hip.  Did not hit head.  C/o lt hip pain.  Unable to assess for shortening bc pt will not put leg down.

## 2012-09-13 NOTE — ED Notes (Signed)
Pt c/o of pain at this time. MD notified.

## 2012-09-13 NOTE — Consult Note (Signed)
Reason for Consult:Left femoral neck fracture Referring Physician: Dr Lorre Nick  Deborah Hunt is an 79 y.o. female.  HPI: Deborah Hunt is an 79 yo female with dementia who resides with her husband at [facility] and was getting up to go to the bathroom earlier today and the door slipped out from her grip and she fell on her left side. She does not recall any events associated with the fall but her husband described the event. She only complains of left hip pain. She did not have any LOC according to the husband. She has had an unsteady gait for approximately 5 years now. Her husband feels that the dementia and the unsteadiness began after an adverse reaction to general anesthesia. Her dementia has been progressive. She remains ambulatory despite the unsteadiness.  Past Medical History  Diagnosis Date  . Dementia   . GERD (gastroesophageal reflux disease)   . Diverticulosis   . Arthritis   . Osteoporosis     History reviewed. No pertinent past surgical history.  No family history on file.  Social History:  has no tobacco, alcohol, and drug history on file.  Allergies: No Known Allergies  Medications: I have reviewed the patient's current medications.  Results for orders placed during the hospital encounter of 09/13/12 (from the past 48 hour(s))  CBC WITH DIFFERENTIAL     Status: Abnormal   Collection Time    09/13/12  7:24 PM      Result Value Range   WBC 14.3 (*) 4.0 - 10.5 K/uL   RBC 4.23  3.87 - 5.11 MIL/uL   Hemoglobin 13.3  12.0 - 15.0 g/dL   HCT 45.4  09.8 - 11.9 %   MCV 88.4  78.0 - 100.0 fL   MCH 31.4  26.0 - 34.0 pg   MCHC 35.6  30.0 - 36.0 g/dL   RDW 14.7  82.9 - 56.2 %   Platelets 203  150 - 400 K/uL   Neutrophils Relative 86 (*) 43 - 77 %   Neutro Abs 12.2 (*) 1.7 - 7.7 K/uL   Lymphocytes Relative 9 (*) 12 - 46 %   Lymphs Abs 1.3  0.7 - 4.0 K/uL   Monocytes Relative 5  3 - 12 %   Monocytes Absolute 0.7  0.1 - 1.0 K/uL   Eosinophils Relative 0  0  - 5 %   Eosinophils Absolute 0.0  0.0 - 0.7 K/uL   Basophils Relative 0  0 - 1 %   Basophils Absolute 0.0  0.0 - 0.1 K/uL  BASIC METABOLIC PANEL     Status: Abnormal   Collection Time    09/13/12  7:24 PM      Result Value Range   Sodium 136  135 - 145 mEq/L   Potassium 3.9  3.5 - 5.1 mEq/L   Chloride 105  96 - 112 mEq/L   CO2 19  19 - 32 mEq/L   Glucose, Bld 89  70 - 99 mg/dL   BUN 10  6 - 23 mg/dL   Creatinine, Ser 1.30  0.50 - 1.10 mg/dL   Calcium 8.5  8.4 - 86.5 mg/dL   GFR calc non Af Amer 87 (*) >90 mL/min   GFR calc Af Amer >90  >90 mL/min   Comment:            The eGFR has been calculated     using the CKD EPI equation.     This calculation has not been  validated in all clinical     situations.     eGFR's persistently     <90 mL/min signify     possible Chronic Kidney Disease.    Dg Chest 2 View  09/13/2012  *RADIOLOGY REPORT*  Clinical Data: Fall  CHEST - 2 VIEW  Comparison: None.  Findings: Pleuroparenchymal changes right greater than left at the lung apices are stable.  Hyperaeration, bronchitic changes, interstitial prominence are stable.  Cardiomegaly is stable.  The aortic knob is not significantly changed.  Atherosclerotic calcifications are again noted.  No pneumothorax.  No obvious acute rib fracture.  IMPRESSION: Chronic changes.  No active cardiopulmonary disease.   Original Report Authenticated By: Jolaine Click, M.D.    Dg Lumbar Spine Complete  09/13/2012  *RADIOLOGY REPORT*  Clinical Data: Fall  LUMBAR SPINE - COMPLETE 4+ VIEW  Comparison: None.  Findings: This study is limited by suboptimal technique.  No obvious acute compression deformity.  Osteopenia.  Stable alignment.  Stable appearance of the discs.  IMPRESSION: Limited exam.  No obvious acute lumbar vertebral compression fracture.   Original Report Authenticated By: Jolaine Click, M.D.    Dg Pelvis 1-2 Views  09/13/2012  *RADIOLOGY REPORT*  Clinical Data: Fall  PELVIS - 1-2 VIEW  Comparison: None.   Findings: Severe osteopenia.   The left femoral head and neck have an abnormal configuration.  Impaction fracture  of the femoral neck into the femoral head is not excluded.  IMPRESSION: Findings are worrisome for a subcapital impaction fracture of the left femoral neck.  CT or MR is recommended to further characterize peri   Original Report Authenticated By: Jolaine Click, M.D.    Dg Hip Complete Left  09/13/2012  *RADIOLOGY REPORT*  Clinical Data: Fall  LEFT HIP - COMPLETE 2+ VIEW  Comparison: None.  Findings: There is suspected impaction of the left femoral neck into the left femoral head.  Osteopenia.  Femur is otherwise intact.  IMPRESSION: Findings are worrisome for a subcapital impaction fracture of the left femoral neck.  CT or MR is recommended to confirm.   Original Report Authenticated By: Jolaine Click, M.D.    Ct Hip Left Wo Contrast  09/13/2012  *RADIOLOGY REPORT*  Clinical Data: Fall, left hip pain, possible abnormality of prior images today  CT OF THE LEFT HIP WITHOUT CONTRAST  Technique:  Multidetector CT imaging was performed according to the standard protocol. Multiplanar CT image reconstructions were also generated.  Comparison: Left hip radiographs same date  Findings: There is an acute subcapital left femoral fracture. Femoral head is properly located with respect to the acetabulum. Bones are osteopenic.  Moderate stool volume noted within visualized portions of nondilated colon.  Bladder appears normal.  IMPRESSION: Acute left subcapital femoral fracture.   Original Report Authenticated By: Christiana Pellant, M.D.     ROS Blood pressure 139/65, pulse 84, temperature 99.1 F (37.3 C), temperature source Oral, resp. rate 18, SpO2 92.00%. Physical Exam  Physical Examination: General appearance - alert, well appearing, and in no distress Mental status - alert, oriented to person, place, and time, disoriented to place and time Chest - clear to auscultation, no wheezes, rales or rhonchi,  symmetric air entry Heart - normal rate, regular rhythm, normal S1, S2, no murmurs, rubs, clicks or gallops Abdomen - soft, nontender, nondistended, no masses or organomegaly Extremities - no pedal edema noted, feet warm, able to wiggle toes, tender over lateral left hip, no deformity or shortening LLE   Assessment/Plan: Left femoral neck fracture-  The fracture is non-displaced and thus amenable to pinning instead of an arthroplasty procedure. Family wants surgical treatment for pain control and to allow for mobilization. I discussed the procedure, risks and potential complications of surgical treatment (femoral neck pinning) and the family would like for her to proceed. I also discussed the rehab course and need for a skilled nursing admit post- hospital stay. Also discussed the difficulties associated with recovery in the setting of dementia. Will plan on proceeding with surgery tomorrow afternoon as long as she is cleared medically.  [name] 09/13/2012, 8:39 PM

## 2012-09-13 NOTE — Progress Notes (Signed)
CSW aware of potential disposition needs, csw awaiting further medical evaluation to help determine pt needs.   Catha Gosselin, LCSWA  [phone] 09/13/2012 1809pm

## 2012-09-13 NOTE — ED Provider Notes (Signed)
History     CSN: [id number]  Arrival date & time 09/13/12  1328   First MD Initiated Contact with Patient 09/13/12 1623      Chief Complaint  Patient presents with  . Fall  . Hip Pain    (Consider location/radiation/quality/duration/timing/severity/associated sxs/prior treatment) Patient is a 79 y.o. female presenting with fall and hip pain. The history is provided by the patient. The history is limited by the condition of the patient.  Fall The accident occurred less than 1 hour ago. The fall occurred while walking. She fell from a height of 1 to 2 ft. She landed on carpet. The pain is present in the left hip. The pain is moderate. She was not ambulatory at the scene. There was no entrapment after the fall. Pertinent negatives include no numbness, no abdominal pain and no loss of consciousness. The symptoms are aggravated by activity. She has tried nothing for the symptoms.  Hip Pain Pertinent negatives include no abdominal pain.    Past Medical History  Diagnosis Date  . Dementia   . GERD (gastroesophageal reflux disease)   . Diverticulosis   . Arthritis   . Osteoporosis     History reviewed. No pertinent past surgical history.  No family history on file.  History  Substance Use Topics  . Smoking status: Not on file  . Smokeless tobacco: Not on file  . Alcohol Use: Not on file    OB History   Grav Para Term Preterm Abortions TAB SAB Ect Mult Living                  Review of Systems  Gastrointestinal: Negative for abdominal pain.  Neurological: Negative for loss of consciousness and numbness.  All other systems reviewed and are negative.    Allergies  Review of patient's allergies indicates no known allergies.  Home Medications   Current Outpatient Rx  Name  Route  Sig  Dispense  Refill  . alendronate (FOSAMAX) 70 MG tablet   Oral   Take 70 mg by mouth every 7 (seven) days. Take with a full glass of water on an empty stomach. Monday         .  carbidopa-levodopa (SINEMET IR) 25-100 MG per tablet   Oral   Take 0.5-1.5 tablets by mouth 3 (three) times daily. Pt takes 1.5 tablets with meals. At 3p pt's caregiver gives her 1/2 tab -1 tablet depending on severity of tremors.         . citalopram (CELEXA) 20 MG tablet   Oral   Take 20 mg by mouth daily.         Marland Kitchen donepezil (ARICEPT) 10 MG tablet   Oral   Take 10 mg by mouth at bedtime.         . meloxicam (MOBIC) 15 MG tablet   Oral   Take 15 mg by mouth as needed for pain.         . Vitamin D, Ergocalciferol, (DRISDOL) 50000 UNITS CAPS   Oral   Take 50,000 Units by mouth every 7 (seven) days. Monday           BP 155/62  Pulse 65  Temp(Src) 97.6 F (36.4 C) (Oral)  Resp 18  SpO2 96%  Physical Exam  Nursing note and vitals reviewed. Constitutional: She is oriented to person, place, and time. She appears well-developed and well-nourished.  Non-toxic appearance. No distress.  HENT:  Head: Normocephalic and atraumatic.  Eyes: Conjunctivae, EOM and lids are  normal. Pupils are equal, round, and reactive to light.  Neck: Normal range of motion. Neck supple. No spinous process tenderness and no muscular tenderness present. No tracheal deviation present. No mass present.  Cardiovascular: Normal rate, regular rhythm and normal heart sounds.  Exam reveals no gallop.   No murmur heard. Pulmonary/Chest: Effort normal and breath sounds normal. No stridor. No respiratory distress. She has no decreased breath sounds. She has no wheezes. She has no rhonchi. She has no rales.  Abdominal: Soft. Normal appearance and bowel sounds are normal. She exhibits no distension. There is no tenderness. There is no rebound and no CVA tenderness.  Musculoskeletal: She exhibits no edema and no tenderness.       Left hip: She exhibits decreased range of motion.  Leg rotated  Neurological: She is alert and oriented to person, place, and time. She has normal strength. No cranial nerve deficit  or sensory deficit. GCS eye subscore is 4. GCS verbal subscore is 5. GCS motor subscore is 6.  Skin: Skin is warm and dry. No abrasion and no rash noted.  Psychiatric: She has a normal mood and affect. Her speech is normal and behavior is normal.    ED Course  Procedures (including critical care time)  Labs Reviewed - No data to display No results found.   No diagnosis found.    MDM  Patient given pain meds and x-rays noted. Spoke with orthopedist on call and he is requesting the hospitalist to make. Spoke with hospitalist and patient to be admitted        Toy Baker, MD 09/13/12 2013

## 2012-09-13 NOTE — Clinical Social Work Psychosocial (Signed)
     Clinical Social Work Department BRIEF PSYCHOSOCIAL ASSESSMENT 09/13/2012  Patient:  CATHERYNE, DEFORD     Account Number:  [id number]     Admit date:  09/13/2012  Clinical Social Worker:  Doree Albee  Date/Time:  09/13/2012 08:30 PM  Referred by:  CSW  Date Referred:  09/13/2012 Referred for  SNF Placement   Other Referral:   Interview type:  Family Other interview type:   pt spouse and daughter    PSYCHOSOCIAL DATA Living Status:  FACILITY Admitted from facility:  [facility] Level of care:  Independent Living Primary support name:  Nare Gaspari Primary support relationship to patient:  SPOUSE Degree of support available:   strong    CURRENT CONCERNS Current Concerns  Post-Acute Placement   Other Concerns:    SOCIAL WORK ASSESSMENT / PLAN CSW met with pt family outside of patient room. Pt has history of dementia and unable to fully engage in conversation. Per family pt is aware that her confusion has gotten worse since anethesia. Pt spouse confirmed that patient lives with him in independent living at [facility].    Pt spouse stated that arrangments have been made for patient to discharge to [facility] skilled for short term rehab. Pt family feel patient will benefit form short term rehab. Pt sposue is unable to proivde full care for patient at home. CSW will confirm this with faiclity in the am.    CSW will complete fl2 for md signature and place in patient shadow chart.   Assessment/plan status:  Psychosocial Support/Ongoing Assessment of Needs Other assessment/ plan:   Information/referral to community resources:   none identified at this time    PATIENTS/FAMILYS RESPONSE TO PLAN OF CARE: Pt family thanked csw for concern and support. Pt family is motivated to assist wtih dc to skilled nursing at [facility].

## 2012-09-13 NOTE — ED Notes (Signed)
MD at bedside. 

## 2012-09-14 ENCOUNTER — Inpatient Hospital Stay (HOSPITAL_COMMUNITY): Payer: Medicare Other | Admitting: Anesthesiology

## 2012-09-14 ENCOUNTER — Inpatient Hospital Stay (HOSPITAL_COMMUNITY): Payer: Medicare Other

## 2012-09-14 ENCOUNTER — Encounter (HOSPITAL_COMMUNITY): Payer: Self-pay | Admitting: Anesthesiology

## 2012-09-14 ENCOUNTER — Encounter (HOSPITAL_COMMUNITY): Payer: Self-pay

## 2012-09-14 ENCOUNTER — Encounter (HOSPITAL_COMMUNITY)
Admission: EM | Disposition: A | Discharge status: Skilled Nursing Facility | Payer: Self-pay | Source: Home / Self Care | Attending: Internal Medicine

## 2012-09-14 HISTORY — PX: HIP PINNING,CANNULATED: SHX1758

## 2012-09-14 LAB — CBC
HCT: 39.7 % (ref 36.0–46.0)
Hemoglobin: 13.7 g/dL (ref 12.0–15.0)
RDW: 12.6 % (ref 11.5–15.5)
WBC: 12 10*3/uL — ABNORMAL HIGH (ref 4.0–10.5)

## 2012-09-14 SURGERY — FIXATION, FEMUR, NECK, PERCUTANEOUS, USING SCREW
Anesthesia: Spinal | Site: Hip | Laterality: Left | Wound class: Clean

## 2012-09-14 MED ORDER — CITALOPRAM HYDROBROMIDE 20 MG PO TABS
20.0000 mg | ORAL_TABLET | Freq: Every day | ORAL | Status: DC
  Administered 2012-09-14 – 2012-09-16 (×3): 20 mg via ORAL
  Filled 2012-09-14 (×4): qty 1

## 2012-09-14 MED ORDER — ACETAMINOPHEN 650 MG RE SUPP: 650.0000 mg | Freq: Four times a day (QID) | RECTAL | Status: DC | PRN

## 2012-09-14 MED ORDER — POLYETHYLENE GLYCOL 3350 17 G PO PACK: 17.0000 g | PACK | Freq: Every day | ORAL | Status: DC | PRN

## 2012-09-14 MED ORDER — MORPHINE SULFATE 2 MG/ML IJ SOLN: 0.5000 mg | INTRAMUSCULAR | Status: DC | PRN

## 2012-09-14 MED ORDER — KETAMINE HCL 10 MG/ML IJ SOLN
INTRAMUSCULAR | Status: DC | PRN
  Administered 2012-09-14: 20 mg via INTRAVENOUS
  Administered 2012-09-14: 10 mg via INTRAVENOUS

## 2012-09-14 MED ORDER — ACETAMINOPHEN 10 MG/ML IV SOLN: INTRAVENOUS | Status: DC | PRN

## 2012-09-14 MED ORDER — BISACODYL 10 MG RE SUPP: 10.0000 mg | Freq: Every day | RECTAL | Status: DC | PRN

## 2012-09-14 MED ORDER — CARBIDOPA-LEVODOPA 25-100 MG PO TABS
1.5000 | ORAL_TABLET | Freq: Three times a day (TID) | ORAL | Status: DC
  Administered 2012-09-14 – 2012-09-16 (×7): 1.5 via ORAL
  Filled 2012-09-14 (×10): qty 1.5

## 2012-09-14 MED ORDER — TRAMADOL HCL 50 MG PO TABS: 50.0000 mg | ORAL_TABLET | Freq: Four times a day (QID) | ORAL | Status: DC | PRN

## 2012-09-14 MED ORDER — LACTATED RINGERS IV SOLN
INTRAVENOUS | Status: DC | PRN
  Administered 2012-09-14: 17:00:00 via INTRAVENOUS

## 2012-09-14 MED ORDER — FENTANYL CITRATE 0.05 MG/ML IJ SOLN: INTRAMUSCULAR | Status: DC | PRN

## 2012-09-14 MED ORDER — MIDAZOLAM HCL 5 MG/5ML IJ SOLN: INTRAMUSCULAR | Status: DC | PRN

## 2012-09-14 MED ORDER — FLEET ENEMA 7-19 GM/118ML RE ENEM: 1.0000 | ENEMA | Freq: Once | RECTAL | Status: AC | PRN

## 2012-09-14 MED ORDER — PROPOFOL 10 MG/ML IV BOLUS
INTRAVENOUS | Status: DC | PRN
  Administered 2012-09-14: 20 mg via INTRAVENOUS
  Administered 2012-09-14: 10 mg via INTRAVENOUS
  Administered 2012-09-14 (×3): 20 mg via INTRAVENOUS

## 2012-09-14 MED ORDER — ONDANSETRON HCL 4 MG/2ML IJ SOLN: 4.0000 mg | Freq: Four times a day (QID) | INTRAMUSCULAR | Status: DC | PRN

## 2012-09-14 MED ORDER — ACETAMINOPHEN 325 MG PO TABS
650.0000 mg | ORAL_TABLET | Freq: Four times a day (QID) | ORAL | Status: DC | PRN
  Administered 2012-09-15 – 2012-09-16 (×3): 650 mg via ORAL
  Filled 2012-09-14 (×3): qty 2

## 2012-09-14 MED ORDER — DONEPEZIL HCL 10 MG PO TABS
10.0000 mg | ORAL_TABLET | Freq: Every day | ORAL | Status: DC
  Administered 2012-09-14 – 2012-09-15 (×2): 10 mg via ORAL
  Filled 2012-09-14 (×3): qty 1

## 2012-09-14 MED ORDER — HYDROCODONE-ACETAMINOPHEN 5-325 MG PO TABS: 1.0000 | ORAL_TABLET | Freq: Four times a day (QID) | ORAL | Status: DC | PRN

## 2012-09-14 MED ORDER — DOCUSATE SODIUM 100 MG PO CAPS
100.0000 mg | ORAL_CAPSULE | Freq: Two times a day (BID) | ORAL | Status: DC
  Administered 2012-09-14 – 2012-09-16 (×4): 100 mg via ORAL

## 2012-09-14 MED ORDER — ENOXAPARIN SODIUM 30 MG/0.3ML ~~LOC~~ SOLN
30.0000 mg | SUBCUTANEOUS | Status: DC
  Administered 2012-09-15 – 2012-09-16 (×2): 30 mg via SUBCUTANEOUS
  Filled 2012-09-14 (×3): qty 0.3

## 2012-09-14 MED ORDER — CEFAZOLIN SODIUM-DEXTROSE 2-3 GM-% IV SOLR
INTRAVENOUS | Status: AC | PRN
  Administered 2012-09-14: 2 g via INTRAVENOUS

## 2012-09-14 MED ORDER — METOCLOPRAMIDE HCL 10 MG PO TABS: 5.0000 mg | ORAL_TABLET | Freq: Three times a day (TID) | ORAL | Status: DC | PRN

## 2012-09-14 MED ORDER — METOCLOPRAMIDE HCL 5 MG/ML IJ SOLN: 5.0000 mg | Freq: Three times a day (TID) | INTRAMUSCULAR | Status: DC | PRN

## 2012-09-14 MED ORDER — CEFAZOLIN SODIUM-DEXTROSE 2-3 GM-% IV SOLR
2.0000 g | Freq: Four times a day (QID) | INTRAVENOUS | Status: AC
  Administered 2012-09-14 – 2012-09-15 (×2): 2 g via INTRAVENOUS
  Filled 2012-09-14 (×2): qty 50

## 2012-09-14 MED ORDER — LACTATED RINGERS IV SOLN: INTRAVENOUS | Status: DC | PRN

## 2012-09-14 MED ORDER — HYDROCODONE-ACETAMINOPHEN 5-325 MG PO TABS
1.0000 | ORAL_TABLET | Freq: Four times a day (QID) | ORAL | Status: DC | PRN
  Administered 2012-09-14: 2 via ORAL
  Filled 2012-09-14: qty 2

## 2012-09-14 MED ORDER — ENOXAPARIN SODIUM 40 MG/0.4ML ~~LOC~~ SOLN: 40.0000 mg | SUBCUTANEOUS | Status: DC

## 2012-09-14 MED ORDER — ACETAMINOPHEN 325 MG PO TABS: 650.0000 mg | ORAL_TABLET | Freq: Four times a day (QID) | ORAL | Status: DC | PRN

## 2012-09-14 MED ORDER — PHENOL 1.4 % MT LIQD: 1.0000 | OROMUCOSAL | Status: DC | PRN

## 2012-09-14 MED ORDER — MENTHOL 3 MG MT LOZG: 1.0000 | LOZENGE | OROMUCOSAL | Status: DC | PRN

## 2012-09-14 MED ORDER — ONDANSETRON HCL 4 MG PO TABS: 4.0000 mg | ORAL_TABLET | Freq: Four times a day (QID) | ORAL | Status: DC | PRN

## 2012-09-14 MED ORDER — LACTATED RINGERS IV SOLN
INTRAVENOUS | Status: DC | PRN
  Administered 2012-09-14: 18:00:00 via INTRAVENOUS

## 2012-09-14 SURGICAL SUPPLY — 35 items
BAG ZIPLOCK 12X15 (MISCELLANEOUS) ×2 IMPLANT
BANDAGE GAUZE ELAST BULKY 4 IN (GAUZE/BANDAGES/DRESSINGS) ×2 IMPLANT
CLOTH BEACON ORANGE TIMEOUT ST (SAFETY) ×2 IMPLANT
DRAPE STERI IOBAN 125X83 (DRAPES) ×2 IMPLANT
DRSG EMULSION OIL 3X16 NADH (GAUZE/BANDAGES/DRESSINGS) ×2 IMPLANT
DRSG MEPILEX BORDER 4X4 (GAUZE/BANDAGES/DRESSINGS) ×2 IMPLANT
DRSG PAD ABDOMINAL 8X10 ST (GAUZE/BANDAGES/DRESSINGS) ×2 IMPLANT
DURAPREP 26ML APPLICATOR (WOUND CARE) ×2 IMPLANT
ELECT REM PT RETURN 9FT ADLT (ELECTROSURGICAL) ×2
ELECTRODE REM PT RTRN 9FT ADLT (ELECTROSURGICAL) ×1 IMPLANT
EVACUATOR 1/8 PVC DRAIN (DRAIN) ×2 IMPLANT
GLOVE BIO SURGEON STRL SZ7.5 (GLOVE) ×2 IMPLANT
GLOVE BIO SURGEON STRL SZ8 (GLOVE) ×4 IMPLANT
GOWN STRL NON-REIN LRG LVL3 (GOWN DISPOSABLE) ×2 IMPLANT
GOWN STRL REIN XL XLG (GOWN DISPOSABLE) ×2 IMPLANT
MANIFOLD NEPTUNE II (INSTRUMENTS) ×2 IMPLANT
NS IRRIG 1000ML POUR BTL (IV SOLUTION) ×2 IMPLANT
PACK GENERAL/GYN (CUSTOM PROCEDURE TRAY) ×2 IMPLANT
PAD CAST 4YDX4 CTTN HI CHSV (CAST SUPPLIES) ×1 IMPLANT
PADDING CAST COTTON 4X4 STRL (CAST SUPPLIES) ×1
PIN THREADED GUIDE ACE (PIN) ×2 IMPLANT
POSITIONER SURGICAL ARM (MISCELLANEOUS) ×2 IMPLANT
SCREW CANN 6.5 60MM (Screw) ×2 IMPLANT
SCREW CANN 6.5 70MM (Screw) ×2 IMPLANT
SCREW CANN LG 6.5 FLT 70X22 (Screw) ×2 IMPLANT
SPONGE GAUZE 4X4 12PLY (GAUZE/BANDAGES/DRESSINGS) ×2 IMPLANT
SPONGE LAP 18X18 X RAY DECT (DISPOSABLE) ×2 IMPLANT
STRIP CLOSURE SKIN 1/2X4 (GAUZE/BANDAGES/DRESSINGS) ×2 IMPLANT
SUT MNCRL AB 3-0 PS2 18 (SUTURE) ×2 IMPLANT
SUT VIC AB 1 CT1 36 (SUTURE) ×4 IMPLANT
SUT VIC AB 2-0 CT1 27 (SUTURE) ×1
SUT VIC AB 2-0 CT1 TAPERPNT 27 (SUTURE) ×1 IMPLANT
TOWEL OR 17X26 10 PK STRL BLUE (TOWEL DISPOSABLE) ×4 IMPLANT
TRAY FOLEY CATH 14FRSI W/METER (CATHETERS) IMPLANT
WATER STERILE IRR 1500ML POUR (IV SOLUTION) ×2 IMPLANT

## 2012-09-14 NOTE — Transfer of Care (Signed)
Immediate Anesthesia Transfer of Care Note  Patient: Deborah Hunt  Procedure(s) Performed: Procedure(s): CANNULATED HIP PINNING (Left)  Patient Location: PACU  Anesthesia Type:Spinal  Level of Consciousness: awake and patient cooperative  Airway & Oxygen Therapy: Patient Spontanous Breathing and Patient connected to face mask oxygen  Post-op Assessment: Report given to PACU RN and Post -op Vital signs reviewed and stable  Post vital signs: Reviewed and stable  Complications: No apparent anesthesia complications

## 2012-09-14 NOTE — Anesthesia Postprocedure Evaluation (Signed)
  Anesthesia Post-op Note  Patient: ANALAURA MESSLER  Procedure(s) Performed: Procedure(s): CANNULATED HIP PINNING (Left)  Patient Location: PACU  Anesthesia Type:Spinal  Level of Consciousness: awake, alert  and patient cooperative  Airway and Oxygen Therapy: Patient Spontanous Breathing and Patient connected to nasal cannula oxygen  Post-op Pain: none  Post-op Assessment: Post-op Vital signs reviewed, Patient's Cardiovascular Status Stable, Respiratory Function Stable, Patent Airway, No signs of Nausea or vomiting and Pain level controlled  Post-op Vital Signs: Reviewed and stable  Complications: No apparent anesthesia complications

## 2012-09-14 NOTE — Interval H&P Note (Signed)
History and Physical Interval Note:  09/14/2012 6:10 PM  Deborah Hunt  has presented today for surgery, with the diagnosis of Left femoral neck fracture  The various methods of treatment have been discussed with the patient and family. After consideration of risks, benefits and other options for treatment, the patient has consented to  Procedure(s): CANNULATED HIP PINNING (Left) as a surgical intervention .  The patient's history has been reviewed, patient examined, no change in status, stable for surgery.  I have reviewed the patient's chart and labs.  Questions were answered to the patient's satisfaction.     [name]

## 2012-09-14 NOTE — Progress Notes (Signed)
(  family was not around)Patient has history of dementia but she was educated about not eating or drinking anything, Ice pitcher emptied and sign put on door.  Hospitalist found patient taking a bite of a donut that her family member have her.  MD educated and RN went back and re-educated family.

## 2012-09-14 NOTE — Progress Notes (Signed)
SNF bed available at [facility] for pt when stable for d/c. CSW will follow to assist with d/c planning to SNF.  Cori Razor LCSW [phone]

## 2012-09-14 NOTE — Progress Notes (Signed)
[facility] PROGRESS NOTE  Deborah Hunt ZOX:[id number] DOB: 1926-11-08 DOA: 09/13/2012 PCP: Bobbie Stack, MD  Brief narrative  79 year old female with a wide body dementia who lives at an assisted-living presented to the ED with fall and left hip pain. Imaging showed acute left subcapital femoral fracture.   Assessment/Plan: Left femoral neck fracture Orthopedics consulted. Patient made n.p.o. with plan for surgery later this afternoon. Continue pain control. Leukocytosis on presentation likely stress-induced and improving. Will need skilled nursing posterior discharge.  lewy  body dementia Continue Sinemet.  DVT prophylaxis   Code Status: DO NOT RESUSCITATE Family Communication: Husband at bedside Disposition Plan: Pending surgery   Consultants:  Orthopedics  Procedures:   left hip surgery later this afternoon  Antibiotics:  None  HPI/Subjective: Admission H&P reviewed. Husband at bedside. Patient denies any pain at this time  Objective: Filed Vitals:   09/13/12 2140 09/14/12 0400 09/14/12 0553 09/14/12 0900  BP: 157/72  152/67 156/75  Pulse: 75  72 75  Temp: 97.8 F (36.6 C)  98.8 F (37.1 C) 97.8 F (36.6 C)  TempSrc: Oral   Oral  Resp: 18 16 18 17   Height: 4\' 10"  (1.473 m)     Weight: 40.2 kg (88 lb 10 oz)     SpO2: 94% 94% 91% 90%    Intake/Output Summary (Last 24 hours) at 09/14/12 1143 Last data filed at 09/14/12 1000  Gross per 24 hour  Intake 620.83 ml  Output    450 ml  Net 170.83 ml   Filed Weights   09/13/12 2140  Weight: 40.2 kg (88 lb 10 oz)    Exam:   General:  Elderly thin built female lying in bed in no acute distress  HEENT: No pallor, moist oral mucosa  Cardiovascular: Normal S1 and S2, no murmurs rub or gallop  Respiratory: Clear to auscultation bilaterally no at its sounds  Abdomen: Soft, nontender, nondistended, bowel sounds present  Musculoskeletal: Warm, painful range of motion over left hip  CNS: AAO x1,  nonfocal  Data Reviewed: Basic Metabolic Panel:  Recent Labs Lab 09/13/12 1924  NA 136  K 3.9  CL 105  CO2 19  GLUCOSE 89  BUN 10  CREATININE 0.51  CALCIUM 8.5   Liver Function Tests: No results found for this basename: AST, ALT, ALKPHOS, BILITOT, PROT, ALBUMIN,  in the last 168 hours No results found for this basename: LIPASE, AMYLASE,  in the last 168 hours No results found for this basename: AMMONIA,  in the last 168 hours CBC:  Recent Labs Lab 09/13/12 1924 09/14/12 0435  WBC 14.3* 12.0*  NEUTROABS 12.2*  --   HGB 13.3 13.7  HCT 37.4 39.7  MCV 88.4 89.4  PLT 203 220   Cardiac Enzymes: No results found for this basename: CKTOTAL, CKMB, CKMBINDEX, TROPONINI,  in the last 168 hours BNP (last 3 results) No results found for this basename: PROBNP,  in the last 8760 hours CBG: No results found for this basename: GLUCAP,  in the last 168 hours  Recent Results (from the past 240 hour(s))  SURGICAL PCR SCREEN     Status: None   Collection Time    09/13/12 10:02 PM      Result Value Range Status   MRSA, PCR NEGATIVE  NEGATIVE Final   Staphylococcus aureus NEGATIVE  NEGATIVE Final   Comment:            The Xpert SA Assay (FDA     approved for NASAL specimens  in patients over 27 years of age),     is one component of     a comprehensive surveillance     program.  Test performance has     been validated by [employer] for patients greater     than or equal to 86 year old.     It is not intended     to diagnose infection nor to     guide or monitor treatment.     Studies: Dg Chest 2 View  09/13/2012  *RADIOLOGY REPORT*  Clinical Data: Fall  CHEST - 2 VIEW  Comparison: None.  Findings: Pleuroparenchymal changes right greater than left at the lung apices are stable.  Hyperaeration, bronchitic changes, interstitial prominence are stable.  Cardiomegaly is stable.  The aortic knob is not significantly changed.  Atherosclerotic calcifications are again noted.   No pneumothorax.  No obvious acute rib fracture.  IMPRESSION: Chronic changes.  No active cardiopulmonary disease.   Original Report Authenticated By: Jolaine Click, M.D.    Dg Lumbar Spine Complete  09/13/2012  *RADIOLOGY REPORT*  Clinical Data: Fall  LUMBAR SPINE - COMPLETE 4+ VIEW  Comparison: None.  Findings: This study is limited by suboptimal technique.  No obvious acute compression deformity.  Osteopenia.  Stable alignment.  Stable appearance of the discs.  IMPRESSION: Limited exam.  No obvious acute lumbar vertebral compression fracture.   Original Report Authenticated By: Jolaine Click, M.D.    Dg Pelvis 1-2 Views  09/13/2012  *RADIOLOGY REPORT*  Clinical Data: Fall  PELVIS - 1-2 VIEW  Comparison: None.  Findings: Severe osteopenia.   The left femoral head and neck have an abnormal configuration.  Impaction fracture  of the femoral neck into the femoral head is not excluded.  IMPRESSION: Findings are worrisome for a subcapital impaction fracture of the left femoral neck.  CT or MR is recommended to further characterize peri   Original Report Authenticated By: Jolaine Click, M.D.    Dg Hip Complete Left  09/13/2012  *RADIOLOGY REPORT*  Clinical Data: Fall  LEFT HIP - COMPLETE 2+ VIEW  Comparison: None.  Findings: There is suspected impaction of the left femoral neck into the left femoral head.  Osteopenia.  Femur is otherwise intact.  IMPRESSION: Findings are worrisome for a subcapital impaction fracture of the left femoral neck.  CT or MR is recommended to confirm.   Original Report Authenticated By: Jolaine Click, M.D.    Ct Hip Left Wo Contrast  09/13/2012  *RADIOLOGY REPORT*  Clinical Data: Fall, left hip pain, possible abnormality of prior images today  CT OF THE LEFT HIP WITHOUT CONTRAST  Technique:  Multidetector CT imaging was performed according to the standard protocol. Multiplanar CT image reconstructions were also generated.  Comparison: Left hip radiographs same date  Findings: There is an  acute subcapital left femoral fracture. Femoral head is properly located with respect to the acetabulum. Bones are osteopenic.  Moderate stool volume noted within visualized portions of nondilated colon.  Bladder appears normal.  IMPRESSION: Acute left subcapital femoral fracture.   Original Report Authenticated By: Christiana Pellant, M.D.     Scheduled Meds: . carbidopa-levodopa  1.5 tablet Oral TID WC  . citalopram  20 mg Oral Daily  . donepezil  10 mg Oral QHS   Continuous Infusions: . sodium chloride 50 mL/hr at 09/14/12 0600      Time spent: 25 minutes    Patrina Andreas  [facility] Pager [phone]. If 7PM-7AM, please  contact night-coverage at [email or web address], password [facility] 09/14/2012, 11:43 AM  LOS: 1 day

## 2012-09-14 NOTE — Progress Notes (Signed)
Clinical Social Work Department CLINICAL SOCIAL WORK PLACEMENT NOTE 09/14/2012  Patient:  [name], [name]  Account Number:  [id number] Admit date:  09/13/2012  Clinical Social Worker:  Doree Albee  Date/time:  09/13/2012 12:00 M  Clinical Social Work is seeking post-discharge placement for this patient at the following level of care:   SKILLED NURSING   (*CSW will update this form in Epic as items are completed)   09/13/2012  Patient/family provided with [facility] Department of Clinical Social Work's list of facilities offering this level of care within the geographic area requested by the patient (or if unable, by the patient's family).  09/13/2012  Patient/family informed of their freedom to choose among providers that offer the needed level of care, that participate in Medicare, Medicaid or managed care program needed by the patient, have an available bed and are willing to accept the patient.  09/13/2012  Patient/family informed of [facility]' ownership interest in [facility], as well as of the fact that they are under no obligation to receive care at this facility.  PASARR submitted to EDS on 09/14/2012 PASARR number received from EDS on 09/14/2012  FL2 transmitted to all facilities in geographic area requested by pt/family on  09/14/2012 FL2 transmitted to all facilities within larger geographic area on   Patient informed that his/her managed care company has contracts with or will negotiate with  certain facilities, including the following:     Patient/family informed of bed offers received:  09/14/2012 Patient chooses bed at [facility] AT [facility] Physician recommends and patient chooses bed at    Patient to be transferred to [facility] AT [facility] on   Patient to be transferred to facility by   The following physician request were entered in Epic:   Additional Comments: Pt from [facility], per family arrangments made  for pt to be accepted to snf.

## 2012-09-14 NOTE — Progress Notes (Signed)
Utilization Review completed.  

## 2012-09-14 NOTE — Op Note (Signed)
OPERATIVE REPORT   POSTOPERATIVE DIAGNOSIS: Left femoral neck fracture.   POSTOPERATIVE DIAGNOSIS: Left  femoral neck fracture.   PROCEDURE: In situ pinning, Left hip.   SURGEON: Ollen Gross, M.D.   ASSISTANT: None.   ANESTHESIA: Spinal.   ESTIMATED BLOOD LOSS: Minimal.   DRAINS: None.   COMPLICATIONS: None.   CONDITION: Stable to recovery.   BRIEF CLINICAL NOTE: Deborah Hunt is an 79 y.o. female, had a fall  last evening, sustaining a minimally displaced  Left femoral neck  fracture. She has intense pain in the Left hip. She has been cleared  medically. She presents for in situ pinning of the Left femoral neck  fracture.  PROCEDURE IN DETAIL: After successful administration of general  anesthetic, the patient was placed on the fracture table. The Left lower extremity in well-padded traction boot, right lower extremity in  well-padded leg holder. We did not need to apply traction as this was a  nondisplaced fracture. The C-arm spot AP and lateral confirmed that it  did not displace during positioning on the bed. Thigh was then prepped  and draped in usual sterile fashion. The guide pins for the [employer]  cannulated screws. One of those was passed over the top of the thigh  in the configuration, so that it would be in the center of femoral head  and neck on AP view. This incision line was marked and the incision was made  over the lateral part of the femur. About a 2-inch incision was then  made at the appropriate place. The skin was cut through the  subcutaneous tissue to the fascia lata, which was incised in line with  skin incision. Guidepin was passed so that this is in the center of  femoral head and neck on the AP view. Another one was placed inferior  to this and slightly posterior and another one was placed superior to  this and slightly posterior. The lengths are 70, 70, and 60  respectively. The screws were passed over the guidepin and tightened  down to the  lateral cortex of the femur with excellent purchase of the  screws. It effectively compressed the fracture site. The guidepins  were removed. Fluoro spots taken AP, lateral, confirming excellent  position of hardware. The incision was then cleaned and dried, and the  deep tissue was closed with interrupted 2-0 Vicryl, subcu with  interrupted 2-0  Vicryl, subcuticular running 4-0 Monocryl. The  incision was cleaned and dried. Steri-Strips and sterile dressing  applied. She was awakened and transported to recovery in stable  condition.   Ollen Gross, M.D.

## 2012-09-14 NOTE — Anesthesia Procedure Notes (Signed)
Spinal  Patient location during procedure: OR Staffing Anesthesiologist: CARIGNAN, PETER Performed by: anesthesiologist  Preanesthetic Checklist Completed: patient identified, site marked, surgical consent, pre-op evaluation, timeout performed, IV checked, risks and benefits discussed and monitors and equipment checked Spinal Block Patient position: right lateral decubitus Prep: Betadine Patient monitoring: heart rate, continuous pulse ox and blood pressure Approach: right paramedian Location: L2-3 Injection technique: single-shot Needle Needle type: Spinocan  Needle gauge: 22 G Needle length: 9 cm Additional Notes Expiration date of kit checked and confirmed. Patient tolerated procedure well, without complications.     

## 2012-09-14 NOTE — Anesthesia Preprocedure Evaluation (Addendum)
Anesthesia Evaluation  Patient identified by MRN, date of birth, ID band Patient awake and Patient confused    Reviewed: Allergy & Precautions, H&P , NPO status , Patient's Chart, lab work & pertinent test results  History of Anesthesia Complications (+) MALIGNANT HYPERTHERMIANegative for: history of anesthetic complications (patient's husband blames anesthesia from a previous surgery for causing her dementia.)  Airway Mallampati: II TM Distance: >3 FB Neck ROM: Full    Dental no notable dental hx.    Pulmonary neg pulmonary ROS,  breath sounds clear to auscultation  Pulmonary exam normal       Cardiovascular negative cardio ROS  Rhythm:Regular Rate:Normal     Neuro/Psych dementia negative neurological ROS  negative psych ROS   GI/Hepatic negative GI ROS, Neg liver ROS,   Endo/Other  negative endocrine ROS  Renal/GU negative Renal ROS  negative genitourinary   Musculoskeletal negative musculoskeletal ROS (+)   Abdominal   Peds negative pediatric ROS (+)  Hematology negative hematology ROS (+)   Anesthesia Other Findings   Reproductive/Obstetrics negative OB ROS                          Anesthesia Physical Anesthesia Plan  ASA: III  Anesthesia Plan: Spinal   Post-op Pain Management:    Induction:   Airway Management Planned: Simple Face Mask  Additional Equipment:   Intra-op Plan:   Post-operative Plan:   Informed Consent: I have reviewed the patients History and Physical, chart, labs and discussed the procedure including the risks, benefits and alternatives for the proposed anesthesia with the patient or authorized representative who has indicated his/her understanding and acceptance.   Dental advisory given  Plan Discussed with: CRNA  Anesthesia Plan Comments:         Anesthesia Quick Evaluation

## 2012-09-15 LAB — BASIC METABOLIC PANEL
CO2: 20 mEq/L (ref 19–32)
Calcium: 8.3 mg/dL — ABNORMAL LOW (ref 8.4–10.5)
Chloride: 101 mEq/L (ref 96–112)
Glucose, Bld: 84 mg/dL (ref 70–99)
Sodium: 134 mEq/L — ABNORMAL LOW (ref 135–145)

## 2012-09-15 LAB — CBC
Hemoglobin: 12.5 g/dL (ref 12.0–15.0)
MCH: 30.3 pg (ref 26.0–34.0)
RBC: 4.13 MIL/uL (ref 3.87–5.11)

## 2012-09-15 MED ORDER — LACTATED RINGERS IV SOLN: INTRAVENOUS | Status: DC | PRN

## 2012-09-15 MED ORDER — ENSURE COMPLETE PO LIQD
237.0000 mL | Freq: Two times a day (BID) | ORAL | Status: DC
  Administered 2012-09-15 – 2012-09-16 (×2): 237 mL via ORAL

## 2012-09-15 MED ORDER — POTASSIUM CHLORIDE CRYS ER 20 MEQ PO TBCR
40.0000 meq | EXTENDED_RELEASE_TABLET | Freq: Once | ORAL | Status: AC
  Administered 2012-09-15: 40 meq via ORAL
  Filled 2012-09-15: qty 2

## 2012-09-15 NOTE — Progress Notes (Signed)
Physical Therapy Treatment Patient Details Name: Deborah Hunt MRN: [id number] DOB: 11-26-1926 Today's Date: 09/15/2012 Time: 7846-9629 PT Time Calculation (min): 19 min  PT Assessment / Plan / Recommendation Comments on Treatment Session       Follow Up Recommendations  SNF     Does the patient have the potential to tolerate intense rehabilitation     Barriers to Discharge        Equipment Recommendations  None recommended by PT    Recommendations for Other Services OT consult  Frequency Min 3X/week   Plan Discharge plan remains appropriate    Precautions / Restrictions Precautions Precautions: Fall Restrictions LLE Weight Bearing: Weight bearing as tolerated   Pertinent Vitals/Pain     Mobility  Bed Mobility Bed Mobility: Sit to Supine Sit to Supine: 1: +2 Total assist Sit to Supine: Patient Percentage: 20% Details for Bed Mobility Assistance: cues for sequence and use of R LE to self assist Transfers Transfers: Sit to Stand;Stand to Sit Sit to Stand: 1: +2 Total assist Sit to Stand: Patient Percentage: 20% Stand to Sit: 1: +2 Total assist Stand to Sit: Patient Percentage: 20% Stand Pivot Transfers: 1: +2 Total assist Stand Pivot Transfers: Patient Percentage: 40% Details for Transfer Assistance: pt tends to flex trunk forward but able to extend slightly with cues.  Doesn't use UEs effectively on walker    Exercises     PT Diagnosis:    PT Problem List:   PT Treatment Interventions:     PT Goals Acute Rehab PT Goals PT Goal Formulation: Patient unable to participate in goal setting Time For Goal Achievement: 09/21/12 Potential to Achieve Goals: Fair Pt will go Supine/Side to Sit: with min assist PT Goal: Supine/Side to Sit - Progress: Goal set today Pt will go Sit to Supine/Side: with min assist PT Goal: Sit to Supine/Side - Progress: Goal set today Pt will go Sit to Stand: with min assist PT Goal: Sit to Stand - Progress: Goal set today Pt will go  Stand to Sit: with min assist PT Goal: Stand to Sit - Progress: Goal set today Pt will Ambulate: 1 - 15 feet;with min assist;with rolling walker PT Goal: Ambulate - Progress: Goal set today  Visit Information  Last PT Received On: 09/15/12 Assistance Needed: +2    Subjective Data  Subjective: I'm cold Patient Stated Goal: No specific goals expressed   Cognition  Cognition Overall Cognitive Status: Impaired Area of Impairment: Memory;Following commands;Safety/judgement;Attention Arousal/Alertness: Awake/alert Orientation Level: Disoriented to;Place;Time;Situation Behavior During Session: [facility] for tasks performed Current Attention Level: Sustained Memory Deficits: Does not recall mode of injury Following Commands: Follows one step commands consistently (except hand placement:  anxious.  Decreased STM) Safety/Judgement: Decreased awareness of safety precautions;Decreased safety judgement for tasks assessed    Balance     End of Session PT - End of Session Equipment Utilized During Treatment: Gait belt Activity Tolerance: Other (comment);Patient limited by pain Patient left: in bed;with call bell/phone within reach Nurse Communication: Mobility status   GP     BRADSHAW,HUNTER 09/15/2012, 3:33 PM

## 2012-09-15 NOTE — Progress Notes (Addendum)
[facility] PROGRESS NOTE  Deborah Hunt EAV:[id number] DOB: [birth year] DOA: 09/13/2012 PCP: Bobbie Stack, MD  Brief narrative  79 year old female with a wide body dementia who lives at an assisted-living presented to the ED with fall and left hip pain. Imaging showed acute left subcapital femoral fracture.   Assessment/Plan:  Left femoral neck fracture  patient seen by orthopedics and had left hip pinning done yesterday. Tolerated procedure well.. Continue pain control. Leukocytosis on presentation likely stress-induced and improving.  Will need skilled nursing on discharge.   lewy body dementia  Continue Sinemet.   DVT prophylaxis  Code Status: DO NOT RESUSCITATE  Family Communication: none at bedside  Disposition Plan: PT eval. Likely needs SNF   Consultants:  Orthopedics Procedures:  left hip done on 3/2 ALT  Antibiotics:  None   HPI/Subjective:  Patient had cannulated left hip pinning done yesterday. Tolerated procedure well. She remembers having her hip fracture and getting surgery done    Objective: Filed Vitals:   09/14/12 2340 09/15/12 0115 09/15/12 0515 09/15/12 1009  BP: 104/70 118/68 127/75 108/65  Pulse: 83 87 88 86  Temp: 97.9 F (36.6 C) 98.6 F (37 C) 97.4 F (36.3 C) 97.4 F (36.3 C)  TempSrc: Oral Oral Oral Oral  Resp: 20 24 20 18   Height:      Weight:      SpO2: 91% 90% 90% 93%    Intake/Output Summary (Last 24 hours) at 09/15/12 1226 Last data filed at 09/15/12 7829  Gross per 24 hour  Intake   3110 ml  Output    865 ml  Net   2245 ml   Filed Weights   09/13/12 2140  Weight: 40.2 kg (88 lb 10 oz)    Exam: General: Elderly thin built female lying in bed in no acute distress  HEENT: No pallor, moist oral mucosa  Cardiovascular: Normal S1 and S2, no murmurs rub or gallop  Respiratory: Clear to auscultation bilaterally no at its sounds  Abdomen: Soft, nontender, nondistended, bowel sounds present  Musculoskeletal: Warm,  dressing over left hip. Minimal swelling. painful range of motion over left hip  CNS: AAO x1, nonfocal   Data Reviewed: Basic Metabolic Panel:  Recent Labs Lab 09/13/12 1924 09/15/12 0505  NA 136 134*  K 3.9 3.2*  CL 105 101  CO2 19 20  GLUCOSE 89 84  BUN 10 10  CREATININE 0.51 0.50  CALCIUM 8.5 8.3*   Liver Function Tests: No results found for this basename: AST, ALT, ALKPHOS, BILITOT, PROT, ALBUMIN,  in the last 168 hours No results found for this basename: LIPASE, AMYLASE,  in the last 168 hours No results found for this basename: AMMONIA,  in the last 168 hours CBC:  Recent Labs Lab 09/13/12 1924 09/14/12 0435 09/15/12 0505  WBC 14.3* 12.0* 11.2*  NEUTROABS 12.2*  --   --   HGB 13.3 13.7 12.5  HCT 37.4 39.7 36.8  MCV 88.4 89.4 89.1  PLT 203 220 178   Cardiac Enzymes: No results found for this basename: CKTOTAL, CKMB, CKMBINDEX, TROPONINI,  in the last 168 hours BNP (last 3 results) No results found for this basename: PROBNP,  in the last 8760 hours CBG: No results found for this basename: GLUCAP,  in the last 168 hours  Recent Results (from the past 240 hour(s))  SURGICAL PCR SCREEN     Status: None   Collection Time    09/13/12 10:02 PM      Result Value Range  Status   MRSA, PCR NEGATIVE  NEGATIVE Final   Staphylococcus aureus NEGATIVE  NEGATIVE Final   Comment:            The Xpert SA Assay (FDA     approved for NASAL specimens     in patients over 58 years of age),     is one component of     a comprehensive surveillance     program.  Test performance has     been validated by [employer] for patients greater     than or equal to 27 year old.     It is not intended     to diagnose infection nor to     guide or monitor treatment.     Studies: Dg Chest 2 View  09/13/2012  *RADIOLOGY REPORT*  Clinical Data: Fall  CHEST - 2 VIEW  Comparison: None.  Findings: Pleuroparenchymal changes right greater than left at the lung apices are stable.   Hyperaeration, bronchitic changes, interstitial prominence are stable.  Cardiomegaly is stable.  The aortic knob is not significantly changed.  Atherosclerotic calcifications are again noted.  No pneumothorax.  No obvious acute rib fracture.  IMPRESSION: Chronic changes.  No active cardiopulmonary disease.   Original Report Authenticated By: Jolaine Click, M.D.    Dg Lumbar Spine Complete  09/13/2012  *RADIOLOGY REPORT*  Clinical Data: Fall  LUMBAR SPINE - COMPLETE 4+ VIEW  Comparison: None.  Findings: This study is limited by suboptimal technique.  No obvious acute compression deformity.  Osteopenia.  Stable alignment.  Stable appearance of the discs.  IMPRESSION: Limited exam.  No obvious acute lumbar vertebral compression fracture.   Original Report Authenticated By: Jolaine Click, M.D.    Dg Pelvis 1-2 Views  09/13/2012  *RADIOLOGY REPORT*  Clinical Data: Fall  PELVIS - 1-2 VIEW  Comparison: None.  Findings: Severe osteopenia.   The left femoral head and neck have an abnormal configuration.  Impaction fracture  of the femoral neck into the femoral head is not excluded.  IMPRESSION: Findings are worrisome for a subcapital impaction fracture of the left femoral neck.  CT or MR is recommended to further characterize peri   Original Report Authenticated By: Jolaine Click, M.D.    Dg Hip Complete Left  09/13/2012  *RADIOLOGY REPORT*  Clinical Data: Fall  LEFT HIP - COMPLETE 2+ VIEW  Comparison: None.  Findings: There is suspected impaction of the left femoral neck into the left femoral head.  Osteopenia.  Femur is otherwise intact.  IMPRESSION: Findings are worrisome for a subcapital impaction fracture of the left femoral neck.  CT or MR is recommended to confirm.   Original Report Authenticated By: Jolaine Click, M.D.    Dg Hip Operative Left  09/14/2012  *RADIOLOGY REPORT*  Clinical Data: Half of the left subcapital femoral neck fracture  OPERATIVE LEFT HIP  Comparison: Pelvis CT, 09/13/2012  Findings: Two spot  fluorographic images show placement of three screws across the femoral neck fracture maintaining the fracture fragments in near anatomic alignment.  The left hip joint is normally spaced and aligned.  No evidence of an operative complication.   Original Report Authenticated By: Amie Portland, M.D.    Dg Pelvis Portable  09/14/2012  *RADIOLOGY REPORT*  Clinical Data: Postop ORIF left hip  PORTABLE PELVIS  Comparison: Preoperative radiographs 09/13/2012  Findings: Cannulated lag screw fixation of subcapital femoral fracture.  No evidence of immediate hardware complication.  The bones diffusely osteopenic.  Unremarkable bowel gas pattern. Moderate rectal stool ball.  IMPRESSION: Cannulated lag screw ORIF of subcapital femoral fracture without evidence of immediate hardware complication.   Original Report Authenticated By: Malachy Moan, M.D.    Ct Hip Left Wo Contrast  09/13/2012  *RADIOLOGY REPORT*  Clinical Data: Fall, left hip pain, possible abnormality of prior images today  CT OF THE LEFT HIP WITHOUT CONTRAST  Technique:  Multidetector CT imaging was performed according to the standard protocol. Multiplanar CT image reconstructions were also generated.  Comparison: Left hip radiographs same date  Findings: There is an acute subcapital left femoral fracture. Femoral head is properly located with respect to the acetabulum. Bones are osteopenic.  Moderate stool volume noted within visualized portions of nondilated colon.  Bladder appears normal.  IMPRESSION: Acute left subcapital femoral fracture.   Original Report Authenticated By: Christiana Pellant, M.D.     Scheduled Meds: . carbidopa-levodopa  1.5 tablet Oral TID WC  . citalopram  20 mg Oral Daily  . docusate sodium  100 mg Oral BID  . donepezil  10 mg Oral QHS  . enoxaparin (LOVENOX) injection  30 mg Subcutaneous Q24H  . feeding supplement  237 mL Oral BID BM  . potassium chloride  40 mEq Oral Once   Continuous Infusions: . sodium chloride 50  mL/hr at 09/14/12 2005       Time spent: 25 minutes    DHUNGEL, NISHANT  [facility] Pager [phone] If 7PM-7AM, please contact night-coverage at [email or web address], password [facility] 09/15/2012, 12:26 PM  LOS: 2 days

## 2012-09-15 NOTE — Progress Notes (Signed)
   Subjective: 1 Day Post-Op Procedure(s) (LRB): CANNULATED HIP PINNING (Left) Patient reports pain as mild.   Patient seen in rounds by Dr. Lequita Halt. Patient is resting comfortably.  Pleasantly confused. We will start therapy today.  Plan is to go Skilled nursing facility after hospital stay.  Objective: Vital signs in last 24 hours: Temp:  [97.4 F (36.3 C)-98.6 F (37 C)] 97.4 F (36.3 C) (03/13 0515) Pulse Rate:  [69-92] 88 (03/13 0515) Resp:  [14-24] 20 (03/13 0515) BP: (103-156)/(64-90) 127/75 mmHg (03/13 0515) SpO2:  [90 %-99 %] 90 % (03/13 0515)  Intake/Output from previous day:  Intake/Output Summary (Last 24 hours) at 09/15/12 0854 Last data filed at 09/15/12 0700  Gross per 24 hour  Intake   3070 ml  Output    990 ml  Net   2080 ml    Labs:  Recent Labs  09/13/12 1924 09/14/12 0435 09/15/12 0505  HGB 13.3 13.7 12.5    Recent Labs  09/14/12 0435 09/15/12 0505  WBC 12.0* 11.2*  RBC 4.44 4.13  HCT 39.7 36.8  PLT 220 178    Recent Labs  09/13/12 1924 09/15/12 0505  NA 136 134*  K 3.9 3.2*  CL 105 101  CO2 19 20  BUN 10 10  CREATININE 0.51 0.50  GLUCOSE 89 84  CALCIUM 8.5 8.3*   No results found for this basename: LABPT, INR,  in the last 72 hours  EXAM General - Patient is Alert and Confused Extremity - Neurovascular intact Sensation intact distally Dressing - dressing C/D/I Motor Function - intact, moving foot and toes well on exam.   Past Medical History  Diagnosis Date  . Dementia   . GERD (gastroesophageal reflux disease)   . Diverticulosis   . Arthritis   . Osteoporosis   . Complication of anesthesia     caused brain disease    Assessment/Plan: 1 Day Post-Op Procedure(s) (LRB): CANNULATED HIP PINNING (Left) Principal Problem:   Hip fracture, left Active Problems:   Dementia with Lewy bodies  Estimated body mass index is 18.53 kg/(m^2) as calculated from the following:   Height as of this encounter: 4\' 10"  (1.473  m).   Weight as of this encounter: 40.2 kg (88 lb 10 oz). Up with therapy Discharge to SNF when medically stable  DVT Prophylaxis - Lovenox Weight Bearing As Tolerated left Leg Begin Therapy  Patient will need SNF postop. May be WBAT Lovenox for DVT for 10 days Follow up in office with Dr. Lequita Halt in 2 weeks.  The SNF will need to make follow up appointment and help make arrangements for transportation tot he office. Above information has been outlined in the discharge plans.  PERKINS, ALEXZANDREW 09/15/2012, 8:54 AM

## 2012-09-15 NOTE — Evaluation (Signed)
Occupational Therapy Evaluation Patient Details Name: MEAGHANN CHOO MRN: [id number] DOB: 04-13-1927 Today's Date: 09/15/2012 Time: 7829-5621 OT Time Calculation (min): 22 min  OT Assessment / Plan / Recommendation Clinical Impression  This 79 year old female was admitted after a fall resulting in L hip fx which was surgically managed with pinning.  Pt has a h/o of dementia:  PLOF is unknown.  Pt will benefit from skilled OT to increase independence with adls and transfers to 3:1 commode.      OT Assessment  Patient needs continued OT Services    Follow Up Recommendations  SNF    Barriers to Discharge      Equipment Recommendations  3 in 1 bedside comode    Recommendations for Other Services    Frequency  Min 1X/week    Precautions / Restrictions Precautions Precautions: Fall Restrictions Weight Bearing Restrictions: No LLE Weight Bearing: Weight bearing as tolerated   Pertinent Vitals/Pain C/o L hip pain when weight bearing:  Not rated.  Better when repositioned    ADL  Grooming: Wash/dry hands;Wash/dry face;Set up Where Assessed - Grooming: Unsupported sitting Upper Body Bathing: Supervision/safety Where Assessed - Upper Body Bathing: Unsupported sitting Lower Body Bathing: +2 Total assistance Lower Body Bathing: Patient Percentage: 30% Where Assessed - Lower Body Bathing: Supported sit to stand Upper Body Dressing: Minimal assistance Where Assessed - Upper Body Dressing: Unsupported sitting Lower Body Dressing: +2 Total assistance Lower Body Dressing: Patient Percentage: 0% Where Assessed - Lower Body Dressing: Supported sit to stand Toilet Transfer: +2 Total assistance Toilet Transfer: Patient Percentage: 20% Toilet Transfer Method: Surveyor, minerals: Materials engineer and Hygiene: +2 Total assistance Toileting - Architect and Hygiene: Patient Percentage: 0% Where Assessed - Glass blower/designer  Manipulation and Hygiene: Standing Transfers/Ambulation Related to ADLs: pt completed spt to 3:1 with max cueing and A x 2.  after hygiene completed, 3:1 removed and bed brought up behind her.      OT Diagnosis: Generalized weakness  OT Problem List: Decreased strength;Decreased activity tolerance;Impaired balance (sitting and/or standing);Decreased cognition;Decreased safety awareness;Decreased knowledge of use of DME or AE;Pain OT Treatment Interventions: Self-care/ADL training;DME and/or AE instruction;Therapeutic activities;Patient/family education;Balance training   OT Goals Acute Rehab OT Goals OT Goal Formulation: Patient unable to participate in goal setting Time For Goal Achievement: 09/22/12 Potential to Achieve Goals: Fair ADL Goals Pt Will Transfer to Toilet: with 2+ total assist;Stand pivot transfer;3-in-1 (pt 40%) ADL Goal: Toilet Transfer - Progress: Goal set today Miscellaneous OT Goals Miscellaneous OT Goal #1: Pt will go from sit to stand with total A x 2 pt 40% and maintain this for 2 minutes with same level of assistance for adls OT Goal: Miscellaneous Goal #1 - Progress: Goal set today  Visit Information  Last OT Received On: 09/15/12 Assistance Needed: +2    Subjective Data  Subjective: I think I'm done (on commode) Patient Stated Goal: none stated.  Agreeable to OT/PT   Prior Functioning     Home Living Lives With: Spouse Additional Comments: Pt and spouse are both resident's at [facility] Prior Function Comments: unknown Communication Communication: No difficulties         Vision/Perception     Copywriter, advertising Overall Cognitive Status: Impaired Area of Impairment: Memory;Following commands;Safety/judgement;Attention Arousal/Alertness: Awake/alert Orientation Level: Disoriented to;Place;Time;Situation Behavior During Session: [facility] for tasks performed Current Attention Level: Sustained Memory Deficits: Does not recall mode of  injury Following Commands: Follows one step commands consistently (except  hand placement:  anxious.  Decreased STM) Safety/Judgement: Decreased awareness of safety precautions;Decreased safety judgement for tasks assessed    Extremity/Trunk Assessment Right Upper Extremity Assessment RUE ROM/Strength/Tone: WFL for tasks assessed (bil grips are good) Left Upper Extremity Assessment LUE ROM/Strength/Tone: WFL for tasks assessed     Mobility Bed Mobility Sit to Supine: 1: +2 Total assist Sit to Supine: Patient Percentage: 20% Transfers Sit to Stand: 1: +2 Total assist Sit to Stand: Patient Percentage: 20% Stand to Sit: 1: +2 Total assist Stand to Sit: Patient Percentage: 20% Details for Transfer Assistance: pt tends to flex trunk forward but able to extend slightly with cues.  Doesn't use UEs effectively on walker     Exercise    Balance     End of Session OT - End of Session Activity Tolerance: Patient limited by fatigue Patient left: in bed;with call bell/phone within reach;with bed alarm set  GO     SPENCER,MARYELLEN 09/15/2012, 3:16 PM [name], OTR/L [phone] 09/15/2012

## 2012-09-15 NOTE — Evaluation (Signed)
Physical Therapy Evaluation Patient Details Name: Deborah Hunt MRN: [id number] DOB: 10/23/1926 Today's Date: 09/15/2012 Time: 6213-0865 PT Time Calculation (min): 21 min  PT Assessment / Plan / Recommendation Clinical Impression  Pt s/p L hip fx presents with decreased L LE strength/ROM, post op pain and premorbid cognitive deficits limiting functional mobility    PT Assessment  Patient needs continued PT services    Follow Up Recommendations  SNF    Does the patient have the potential to tolerate intense rehabilitation      Barriers to Discharge        Equipment Recommendations  None recommended by PT    Recommendations for Other Services OT consult   Frequency Min 3X/week    Precautions / Restrictions Precautions Precautions: Fall Restrictions Weight Bearing Restrictions: No LLE Weight Bearing: Weight bearing as tolerated   Pertinent Vitals/Pain Pt c/o pain with movement but unable to rate      Mobility  Bed Mobility Bed Mobility: Supine to Sit Supine to Sit: 1: +2 Total assist Supine to Sit: Patient Percentage: 20% Transfers Transfers: Sit to Stand;Stand to Sit Sit to Stand: 1: +2 Total assist Sit to Stand: Patient Percentage: 20% Stand to Sit: 1: +2 Total assist Stand to Sit: Patient Percentage: 20% Stand Pivot Transfers: 1: +2 Total assist Stand Pivot Transfers: Patient Percentage: 40% Details for Transfer Assistance: Pt accepting min wt on L LE and with ltd WB on UEs through RW to self assist    Exercises General Exercises - Lower Extremity Ankle Circles/Pumps: AROM;AAROM;10 reps;Supine;Both Heel Slides: AAROM;10 reps;Supine;Left Hip ABduction/ADduction: AAROM;10 reps;Supine;Left   PT Diagnosis: Difficulty walking  PT Problem List: Decreased strength;Decreased range of motion;Decreased activity tolerance;Decreased balance;Decreased mobility;Decreased cognition;Decreased knowledge of use of DME;Pain PT Treatment Interventions: DME instruction;Gait  training;Stair training;Functional mobility training;Therapeutic exercise;Therapeutic activities;Patient/family education   PT Goals Acute Rehab PT Goals PT Goal Formulation: Patient unable to participate in goal setting Time For Goal Achievement: 09/21/12 Potential to Achieve Goals: Fair Pt will go Supine/Side to Sit: with min assist PT Goal: Supine/Side to Sit - Progress: Goal set today Pt will go Sit to Supine/Side: with min assist PT Goal: Sit to Supine/Side - Progress: Goal set today Pt will go Sit to Stand: with min assist PT Goal: Sit to Stand - Progress: Goal set today Pt will go Stand to Sit: with min assist PT Goal: Stand to Sit - Progress: Goal set today Pt will Ambulate: 1 - 15 feet;with min assist;with rolling walker PT Goal: Ambulate - Progress: Goal set today  Visit Information  Last PT Received On: 09/15/12 Assistance Needed: +2    Subjective Data  Subjective: I don't know how I hurt myself or what I'm doing here Patient Stated Goal: No specific goals expressed   Prior Functioning  Home Living Lives With: Spouse Additional Comments: Pt and spouse are both resident's at [facility] Communication Communication: No difficulties    Cognition  Cognition Overall Cognitive Status: Impaired Area of Impairment: Memory;Following commands;Safety/judgement;Attention Arousal/Alertness: Awake/alert Orientation Level: Disoriented to;Place;Time;Situation Behavior During Session: [facility] for tasks performed Current Attention Level: Alternating Memory Deficits: Does not recall mode of injury Following Commands: Follows one step commands inconsistently Safety/Judgement: Decreased awareness of safety precautions;Decreased safety judgement for tasks assessed    Extremity/Trunk Assessment Right Upper Extremity Assessment RUE ROM/Strength/Tone: [facility] for tasks assessed Left Upper Extremity Assessment LUE ROM/Strength/Tone: WFL for tasks assessed Right Lower Extremity  Assessment RLE ROM/Strength/Tone: [facility] for tasks assessed Left Lower Extremity Assessment LLE ROM/Strength/Tone: Deficits LLE ROM/Strength/Tone  Deficits: hip strength 2/5 with AAROM to 75 hip flex and 10 abd - ltd by discomfort   Balance    End of Session PT - End of Session Equipment Utilized During Treatment: Gait belt Activity Tolerance: Patient limited by pain Patient left: in chair;with call bell/phone within reach;with family/visitor present Nurse Communication: Mobility status  GP     BRADSHAW,HUNTER 09/15/2012, 1:26 PM

## 2012-09-15 NOTE — Progress Notes (Signed)
Stopped to see patient at the request of a family member. Patient seemed confused but seemed happy that she is getting good care from the doctors and nurses. Offered prayer for peace and calm and her continuing recovery.

## 2012-09-15 NOTE — Progress Notes (Signed)
INITIAL NUTRITION ASSESSMENT  DOCUMENTATION CODES Per approved criteria  -Not Applicable   INTERVENTION: Provide Ensure Complete po BID, each supplement provides 350 kcal and 13 grams of protein.  NUTRITION DIAGNOSIS: Inadequate oral intake related to poor appetite as evidenced by pt report of wt loss.   Goal: Pt to meet >/= 90% of their estimated nutrition needs  Monitor:  PO intake Wt  Reason for Assessment: MST  79 y.o. female  Admitting Dx: Hip fracture, left  ASSESSMENT: 79 year old female presented for surgery, with the diagnosis of Left femoral neck fracture.  Pt states that she wasn't hungry PTA but, that her appetite is improving ans she ate 90% of her breakfast. Pt reports that she has lost weight with her usual body weight at 115 lbs but, is unable to confirm when she last weighed that much.  Pt states "she has never been a big eater" and her husband has been trying to get her to eat more and drink Ensure recently.   Height: Ht Readings from Last 1 Encounters:  09/13/12 4\' 10"  (1.473 m)    Weight: Wt Readings from Last 1 Encounters:  09/13/12 88 lb 10 oz (40.2 kg)    Ideal Body Weight: 90 lbs  % Ideal Body Weight: 98%  Wt Readings from Last 10 Encounters:  09/13/12 88 lb 10 oz (40.2 kg)  09/13/12 88 lb 10 oz (40.2 kg)    Usual Body Weight: 115 lbs  % Usual Body Weight: 77%  BMI:  Body mass index is 18.53 kg/(m^2).  Estimated Nutritional Needs: Kcal: 1610-9604 Protein: 48-56 grams Fluid: >1.2 L  Skin: Incision on left hip  Nutrition Focused Physical Exam:  Subcutaneous Fat:  Orbital Region: WNL Upper Arm Region: moderate wasting Thoracic and Lumbar Region: moderate wasting  Muscle:  Temple Region: WNL Clavicle Bone Region: moderate wasting Clavicle and Acromion Bone Region: mild wasting Scapular Bone Region: NA Dorsal Hand: moderate wasting Patellar Region: WNL Anterior Thigh Region: WNL Posterior Calf Region: NA  Edema: not  present   Diet Order: General  EDUCATION NEEDS: -No education needs identified at this time   Intake/Output Summary (Last 24 hours) at 09/15/12 1126 Last data filed at 09/15/12 0949  Gross per 24 hour  Intake   3110 ml  Output    865 ml  Net   2245 ml    Last BM: 3/11  Labs:   Recent Labs Lab 09/13/12 1924 09/15/12 0505  NA 136 134*  K 3.9 3.2*  CL 105 101  CO2 19 20  BUN 10 10  CREATININE 0.51 0.50  CALCIUM 8.5 8.3*  GLUCOSE 89 84    CBG (last 3)  No results found for this basename: GLUCAP,  in the last 72 hours  Scheduled Meds: . carbidopa-levodopa  1.5 tablet Oral TID WC  . citalopram  20 mg Oral Daily  . docusate sodium  100 mg Oral BID  . donepezil  10 mg Oral QHS  . enoxaparin (LOVENOX) injection  30 mg Subcutaneous Q24H  . potassium chloride  40 mEq Oral Once    Continuous Infusions: . sodium chloride 50 mL/hr at 09/14/12 2005    Past Medical History  Diagnosis Date  . Dementia   . GERD (gastroesophageal reflux disease)   . Diverticulosis   . Arthritis   . Osteoporosis   . Complication of anesthesia     caused brain disease    Past Surgical History  Procedure Laterality Date  . Tubal ligation    .  Nipple removal      from left breast  . Abdominal hysterectomy    . Eye surgery      bilateral cataract surgery    Ian Malkin RD, LDN Inpatient Clinical Dietitian Pager: [phone] After Hours Pager: [phone]

## 2012-09-16 ENCOUNTER — Encounter (HOSPITAL_COMMUNITY): Payer: Self-pay | Admitting: Orthopedic Surgery

## 2012-09-16 LAB — BASIC METABOLIC PANEL
Calcium: 8.1 mg/dL — ABNORMAL LOW (ref 8.4–10.5)
Chloride: 106 mEq/L (ref 96–112)
Creatinine, Ser: 0.54 mg/dL (ref 0.50–1.10)
GFR calc Af Amer: >90 mL/min (ref >90)
Sodium: 137 mEq/L (ref 135–145)

## 2012-09-16 LAB — CBC
MCV: 88.3 fL (ref 78.0–100.0)
Platelets: 189 10*3/uL (ref 150–400)
RDW: 12.7 % (ref 11.5–15.5)
WBC: 9.6 10*3/uL (ref 4.0–10.5)

## 2012-09-16 MED ORDER — ENSURE COMPLETE PO LIQD: 237.0000 mL | Freq: Two times a day (BID) | ORAL | Status: DC

## 2012-09-16 MED ORDER — BISACODYL 10 MG RE SUPP: 10.0000 mg | Freq: Every day | RECTAL | Status: AC | PRN

## 2012-09-16 MED FILL — Bupivacaine HCl Preservative Free (PF) Inj 0.5%: INTRAMUSCULAR | Qty: 30 | Status: AC

## 2012-09-16 NOTE — Progress Notes (Signed)
   Subjective: 2 Days Post-Op Procedure(s) (LRB): CANNULATED HIP PINNING (Left) Patient reports pain as mild when questioned/ Patient seen in rounds for Dr. Lequita Halt. Patient is resting comfortably. Pleasantly confused. Plan is to go Skilled nursing facility after hospital stay.  Objective: Vital signs in last 24 hours: Temp:  [97.1 F (36.2 C)-98.2 F (36.8 C)] 98.2 F (36.8 C) (03/14 0544) Pulse Rate:  [76-93] 76 (03/14 0544) Resp:  [14-18] 14 (03/14 0544) BP: (107-159)/(64-74) 159/74 mmHg (03/14 0544) SpO2:  [4 %-99 %] 89 % (03/14 0544)  Intake/Output from previous day:  Intake/Output Summary (Last 24 hours) at 09/16/12 0723 Last data filed at 09/15/12 2200  Gross per 24 hour  Intake    600 ml  Output    600 ml  Net      0 ml    Intake/Output this shift:    Labs:  Recent Labs  09/13/12 1924 09/14/12 0435 09/15/12 0505 09/16/12 0423  HGB 13.3 13.7 12.5 11.9*    Recent Labs  09/15/12 0505 09/16/12 0423  WBC 11.2* 9.6  RBC 4.13 3.86*  HCT 36.8 34.1*  PLT 178 189    Recent Labs  09/15/12 0505 09/16/12 0423  NA 134* 137  K 3.2* 3.9  CL 101 106  CO2 20 22  BUN 10 9  CREATININE 0.50 0.54  GLUCOSE 84 93  CALCIUM 8.3* 8.1*   No results found for this basename: LABPT, INR,  in the last 72 hours  EXAM General - Patient is Alert Extremity - Neurovascular intact Sensation intact distally Dressing/Incision - clean, dry, no drainage, healing Motor Function - intact, moving foot and toes well on exam.   Past Medical History  Diagnosis Date  . Dementia   . GERD (gastroesophageal reflux disease)   . Diverticulosis   . Arthritis   . Osteoporosis   . Complication of anesthesia     caused brain disease    Assessment/Plan: 2 Days Post-Op Procedure(s) (LRB): CANNULATED HIP PINNING (Left) Principal Problem:   Hip fracture, left Active Problems:   Dementia with Lewy bodies  Estimated body mass index is 18.53 kg/(m^2) as calculated from the  following:   Height as of this encounter: 4\' 10"  (1.473 m).   Weight as of this encounter: 40.2 kg (88 lb 10 oz). Up with therapy  DVT Prophylaxis - Lovenox Weight Bearing As Tolerated left Leg Patient will need SNF postop.  May be WBAT  Lovenox for DVT for 10 days  Follow up in office with Dr. Lequita Halt in 2 weeks. The SNF will need to make follow up appointment and help make arrangements for transportation tot he office.  Above information has been outlined in the discharge plans.  Deborah Hunt 09/16/2012, 7:23 AM

## 2012-09-16 NOTE — Progress Notes (Signed)
Physical Therapy Treatment Patient Details Name: Deborah Hunt MRN: [id number] DOB: 05-Aug-1926 Today's Date: 09/16/2012 Time: 8416-6063 PT Time Calculation (min): 37 min  PT Assessment / Plan / Recommendation Comments on Treatment Session       Follow Up Recommendations  SNF     Does the patient have the potential to tolerate intense rehabilitation     Barriers to Discharge        Equipment Recommendations  None recommended by PT    Recommendations for Other Services OT consult  Frequency Min 3X/week   Plan Discharge plan remains appropriate    Precautions / Restrictions Precautions Precautions: Fall Restrictions Weight Bearing Restrictions: No LLE Weight Bearing: Weight bearing as tolerated   Pertinent Vitals/Pain     Mobility  Transfers Transfers: Sit to Stand;Stand to Sit;Stand Pivot Transfers Sit to Stand: 1: +2 Total assist Sit to Stand: Patient Percentage: 50% Stand to Sit: 1: +2 Total assist Stand to Sit: Patient Percentage: 50% Stand Pivot Transfers: 1: +2 Total assist Stand Pivot Transfers: Patient Percentage: 50% Details for Transfer Assistance: pt tends to flex trunk forward but able to extend slightly with cues.  Doesn't use UEs effectively on walker Ambulation/Gait Ambulation/Gait Assistance: 1: +2 Total assist Ambulation/Gait: Patient Percentage: 60% Ambulation Distance (Feet): 6 Feet Assistive device: Rolling walker Ambulation/Gait Assistance Details: cues for posture, sequence, position from RW, to increase BOS and to increase WB on UEs Gait Pattern: Step-to pattern;Decreased step length - right;Decreased step length - left;Decreased stance time - left;Trunk flexed General Gait Details: Pt c/o lightheadedness - BP 123/70; RN aware    Exercises General Exercises - Lower Extremity Ankle Circles/Pumps: AROM;AAROM;Supine;Both;15 reps Quad Sets: AROM;10 reps;Both;Supine Heel Slides: AAROM;Supine;Left;15 reps Hip ABduction/ADduction:  AAROM;Supine;Left;15 reps   PT Diagnosis:    PT Problem List:   PT Treatment Interventions:     PT Goals Acute Rehab PT Goals PT Goal Formulation: Patient unable to participate in goal setting Time For Goal Achievement: 09/21/12 Potential to Achieve Goals: Fair Pt will go Supine/Side to Sit: with min assist PT Goal: Supine/Side to Sit - Progress: Progressing toward goal Pt will go Sit to Supine/Side: with min assist PT Goal: Sit to Supine/Side - Progress: Progressing toward goal Pt will go Sit to Stand: with min assist PT Goal: Sit to Stand - Progress: Progressing toward goal Pt will go Stand to Sit: with min assist PT Goal: Stand to Sit - Progress: Progressing toward goal Pt will Ambulate: 1 - 15 feet;with min assist;with rolling walker PT Goal: Ambulate - Progress: Progressing toward goal  Visit Information  Last PT Received On: 09/16/12 Assistance Needed: +2    Subjective Data  Subjective: I need to use the bathroom again Patient Stated Goal: No specific goals expressed   Cognition  Cognition Overall Cognitive Status: Impaired Area of Impairment: Memory;Following commands;Safety/judgement;Attention Arousal/Alertness: Awake/alert Orientation Level: Disoriented to;Place;Time;Situation Behavior During Session: [facility] for tasks performed Current Attention Level: Sustained Memory Deficits: Does not recall mode of injury Following Commands: Follows one step commands consistently Safety/Judgement: Decreased awareness of safety precautions;Decreased safety judgement for tasks assessed    Balance     End of Session PT - End of Session Equipment Utilized During Treatment: Gait belt Activity Tolerance: Patient tolerated treatment well;Other (comment) Patient left: in chair;with call bell/phone within reach Nurse Communication: Mobility status   GP     BRADSHAW,HUNTER 09/16/2012, 11:05 AM

## 2012-09-16 NOTE — Discharge Summary (Signed)
Physician Discharge Summary  Deborah Hunt OZH:[id number] DOB: 07-Jan-1927 DOA: 09/13/2012  PCP: Deborah Stack, MD  Admit date: 09/13/2012 Discharge date: 09/16/2012  Time spent: 40  minutes  Recommendations for Outpatient Follow-up:  1. SNF with outpt ortho follow up  Discharge Diagnoses:  Principal Problem:   Hip fracture, left  Active Problems:   Dementia with Lewy bodies   Discharge Condition: fair  Diet recommendation: regular  Filed Weights   09/13/12 2140  Weight: 40.2 kg (88 lb 10 oz)    History of present illness:  Please refer to admission H&P for details ,but in brief, 79 year old female with a wide body dementia who lives at an assisted-living presented to the ED with fall and left hip pain. Imaging showed acute left subcapital femoral fracture.   Hospital Course:    Left femoral neck fracture  patient seen by orthopedics and had left hip pinning done on 3/12. Tolerated procedure well. Leukocytosis on presentation likely stress-induced and improving.  Seen by PT and recommends SNF on discharge. Pain control and DVT prophylaxis per ortho. Will follow up with ortho in  2 weeks   lewy body dementia  Continue Sinemet.   Code Status: DO NOT RESUSCITATE  Family Communication: husband at bedside    Disposition Plan: d/c to SNF    Consultants:  Orthopedics ( Dr Despina Hick)   Procedures:  left hip pinning done on 3/12   Antibiotics:  None   Discharge Exam: Filed Vitals:   09/15/12 1404 09/15/12 1600 09/15/12 2035 09/16/12 0544  BP: 107/64  132/70 159/74  Pulse: 93  76 76  Temp: 97.1 F (36.2 C)  98.1 F (36.7 C) 98.2 F (36.8 C)  TempSrc: Oral   Oral  Resp: 18 16 16 14   Height:      Weight:      SpO2: 92% 93% 99% 89%    General: Elderly thin built female lying in bed in no acute distress  HEENT: No pallor, moist oral mucosa  Cardiovascular: Normal S1 and S2, no murmurs rub or gallop  Respiratory: Clear to auscultation bilaterally no at its  sounds  Abdomen: Soft, nontender, nondistended, bowel sounds present  Musculoskeletal: Warm, dressing over left hip. Minimal swelling. painful range of motion over left hip  CNS: AAO x1-2, nonfocal   Discharge Instructions  Discharge Orders   Future Orders Complete By Expires     Full weight bearing  As directed         Medication List    TAKE these medications       alendronate 70 MG tablet  Commonly known as:  FOSAMAX  Take 70 mg by mouth every 7 (seven) days. Take with a full glass of water on an empty stomach. Monday     ARICEPT 10 MG tablet  Generic drug:  donepezil  Take 10 mg by mouth at bedtime.     bisacodyl 10 MG suppository  Commonly known as:  DULCOLAX  Place 1 suppository (10 mg total) rectally daily as needed.     carbidopa-levodopa 25-100 MG per tablet  Commonly known as:  SINEMET IR  Take 0.5-1.5 tablets by mouth 3 (three) times daily. Pt takes 1.5 tablets with meals. At 3p pt's caregiver gives her 1/2 tab -1 tablet depending on severity of tremors.     citalopram 20 MG tablet  Commonly known as:  CELEXA  Take 20 mg by mouth daily.     enoxaparin 40 MG/0.4ML injection  Commonly known as:  LOVENOX  Inject  0.4 mLs (40 mg total) into the skin daily.     feeding supplement Liqd  Take 237 mLs by mouth 2 (two) times daily between meals.     HYDROcodone-acetaminophen 5-325 MG per tablet  Commonly known as:  NORCO/VICODIN  Take 1-2 tablets by mouth every 6 (six) hours as needed for pain.     meloxicam 15 MG tablet  Commonly known as:  MOBIC  Take 15 mg by mouth as needed for pain.     Vitamin D (Ergocalciferol) 50000 UNITS Caps  Commonly known as:  DRISDOL  Take 50,000 Units by mouth every 7 (seven) days. Monday           Follow-up Information   Follow up with [name], MD. Schedule an appointment as soon as possible for a visit in 2 weeks.   Contact information:   [address] [place]  [place] [address] [phone]       Follow up with Deborah Stack, MD In 1 week.   Contact information:   [address] [place] [address] [phone]        The results of significant diagnostics from this hospitalization (including imaging, microbiology, ancillary and laboratory) are listed below for reference.    Significant Diagnostic Studies: Dg Chest 2 View  09/13/2012  *RADIOLOGY REPORT*  Clinical Data: Fall  CHEST - 2 VIEW  Comparison: None.  Findings: Pleuroparenchymal changes right greater than left at the lung apices are stable.  Hyperaeration, bronchitic changes, interstitial prominence are stable.  Cardiomegaly is stable.  The aortic knob is not significantly changed.  Atherosclerotic calcifications are again noted.  No pneumothorax.  No obvious acute rib fracture.  IMPRESSION: Chronic changes.  No active cardiopulmonary disease.   Original Report Authenticated By: Jolaine Click, M.D.    Dg Lumbar Spine Complete  09/13/2012  *RADIOLOGY REPORT*  Clinical Data: Fall  LUMBAR SPINE - COMPLETE 4+ VIEW  Comparison: None.  Findings: This study is limited by suboptimal technique.  No obvious acute compression deformity.  Osteopenia.  Stable alignment.  Stable appearance of the discs.  IMPRESSION: Limited exam.  No obvious acute lumbar vertebral compression fracture.   Original Report Authenticated By: Jolaine Click, M.D.    Dg Pelvis 1-2 Views  09/13/2012  *RADIOLOGY REPORT*  Clinical Data: Fall  PELVIS - 1-2 VIEW  Comparison: None.  Findings: Severe osteopenia.   The left femoral head and neck have an abnormal configuration.  Impaction fracture  of the femoral neck into the femoral head is not excluded.  IMPRESSION: Findings are worrisome for a subcapital impaction fracture of the left femoral neck.  CT or MR is recommended to further characterize peri   Original Report Authenticated By: Jolaine Click, M.D.    Dg Hip Complete Left  09/13/2012  *RADIOLOGY REPORT*  Clinical Data: Fall  LEFT  HIP - COMPLETE 2+ VIEW  Comparison: None.  Findings: There is suspected impaction of the left femoral neck into the left femoral head.  Osteopenia.  Femur is otherwise intact.  IMPRESSION: Findings are worrisome for a subcapital impaction fracture of the left femoral neck.  CT or MR is recommended to confirm.   Original Report Authenticated By: Jolaine Click, M.D.    Dg Hip Operative Left  09/14/2012  *RADIOLOGY REPORT*  Clinical Data: Half of the left subcapital femoral neck fracture  OPERATIVE LEFT HIP  Comparison: Pelvis CT, 09/13/2012  Findings: Two spot fluorographic images show placement of three  screws across the femoral neck fracture maintaining the fracture fragments in near anatomic alignment.  The left hip joint is normally spaced and aligned.  No evidence of an operative complication.   Original Report Authenticated By: Amie Portland, M.D.    Dg Pelvis Portable  09/14/2012  *RADIOLOGY REPORT*  Clinical Data: Postop ORIF left hip  PORTABLE PELVIS  Comparison: Preoperative radiographs 09/13/2012  Findings: Cannulated lag screw fixation of subcapital femoral fracture.  No evidence of immediate hardware complication.  The bones diffusely osteopenic.  Unremarkable bowel gas pattern. Moderate rectal stool ball.  IMPRESSION: Cannulated lag screw ORIF of subcapital femoral fracture without evidence of immediate hardware complication.   Original Report Authenticated By: Malachy Moan, M.D.    Ct Hip Left Wo Contrast  09/13/2012  *RADIOLOGY REPORT*  Clinical Data: Fall, left hip pain, possible abnormality of prior images today  CT OF THE LEFT HIP WITHOUT CONTRAST  Technique:  Multidetector CT imaging was performed according to the standard protocol. Multiplanar CT image reconstructions were also generated.  Comparison: Left hip radiographs same date  Findings: There is an acute subcapital left femoral fracture. Femoral head is properly located with respect to the acetabulum. Bones are osteopenic.   Moderate stool volume noted within visualized portions of nondilated colon.  Bladder appears normal.  IMPRESSION: Acute left subcapital femoral fracture.   Original Report Authenticated By: Christiana Pellant, M.D.     Microbiology: Recent Results (from the past 240 hour(s))  SURGICAL PCR SCREEN     Status: None   Collection Time    09/13/12 10:02 PM      Result Value Range Status   MRSA, PCR NEGATIVE  NEGATIVE Final   Staphylococcus aureus NEGATIVE  NEGATIVE Final   Comment:            The Xpert SA Assay (FDA     approved for NASAL specimens     in patients over 35 years of age),     is one component of     a comprehensive surveillance     program.  Test performance has     been validated by [employer] for patients greater     than or equal to 90 year old.     It is not intended     to diagnose infection nor to     guide or monitor treatment.     Labs: Basic Metabolic Panel:  Recent Labs Lab 09/13/12 1924 09/15/12 0505 09/16/12 0423  NA 136 134* 137  K 3.9 3.2* 3.9  CL 105 101 106  CO2 19 20 22   GLUCOSE 89 84 93  BUN 10 10 9   CREATININE 0.51 0.50 0.54  CALCIUM 8.5 8.3* 8.1*   Liver Function Tests: No results found for this basename: AST, ALT, ALKPHOS, BILITOT, PROT, ALBUMIN,  in the last 168 hours No results found for this basename: LIPASE, AMYLASE,  in the last 168 hours No results found for this basename: AMMONIA,  in the last 168 hours CBC:  Recent Labs Lab 09/13/12 1924 09/14/12 0435 09/15/12 0505 09/16/12 0423  WBC 14.3* 12.0* 11.2* 9.6  NEUTROABS 12.2*  --   --   --   HGB 13.3 13.7 12.5 11.9*  HCT 37.4 39.7 36.8 34.1*  MCV 88.4 89.4 89.1 88.3  PLT 203 220 178 189   Cardiac Enzymes: No results found for this basename: CKTOTAL, CKMB, CKMBINDEX, TROPONINI,  in the last 168 hours BNP: BNP (last 3 results) No results found  for this basename: PROBNP,  in the last 8760 hours CBG: No results found for this basename: GLUCAP,  in the last 168  hours     Signed:  Iziah Cates  [facility] 09/16/2012, 11:05 AM

## 2012-09-21 ENCOUNTER — Encounter: Payer: Self-pay | Admitting: Nurse Practitioner

## 2012-09-21 ENCOUNTER — Non-Acute Institutional Stay (SKILLED_NURSING_FACILITY): Payer: Medicare Other | Admitting: Nurse Practitioner

## 2012-09-21 DIAGNOSIS — F0391 Unspecified dementia with behavioral disturbance: Secondary | ICD-10-CM

## 2012-09-21 NOTE — Progress Notes (Signed)
Subjective:     Patient ID: Deborah Hunt, female   DOB: 1926/09/03, 79 y.o.   MRN: [id number]  HPI  To evaluate a mechanical fall 09/20/12, X-ray 09/20/12 showed s/p L hip ORIF in good alignment and no acute fracture.   Dementia: presently taking Aricept, lack of safety awareness.   Parkinson's Disease: Most of the problem is with her gait, tremors are not apparent in hand, takes Sinemet and follow up with Dr. Sandria Manly.   Review of Systems  Constitutional: Negative for activity change, appetite change and fatigue.  Cardiovascular: Positive for leg swelling.  Gastrointestinal: Positive for constipation.  Genitourinary: Positive for frequency and pelvic pain.  Musculoskeletal: Positive for back pain and gait problem.  Neurological: Negative for tremors.  Psychiatric/Behavioral: Positive for behavioral problems.       Objective:   Physical Exam  Constitutional: She appears well-nourished.  HENT:  Head: Normocephalic and atraumatic.  Eyes: Conjunctivae and EOM are normal. Pupils are equal, round, and reactive to light.  Neck: Normal range of motion. Neck supple. No thyromegaly present.  Cardiovascular: Normal rate and regular rhythm.   Murmur heard. Pulmonary/Chest: No respiratory distress. She has no wheezes. She has no rales.  Abdominal: Soft. Bowel sounds are normal. There is no rebound and no guarding.  Musculoskeletal: Normal range of motion. She exhibits edema.  Neurological: She is alert. A cranial nerve deficit is present. Coordination abnormal.  Skin: Skin is warm and dry.  Psychiatric: She has a normal mood and affect. Her behavior is normal.       Assessment:  Fall: no apparent injury, lack of safety awareness and nature of Parkinsonism all contribute to her fall, intensive supervision needed for safety.  Dementia: progressing, SNF care needed    Plan:    Dementia: continue Aricept, SNF for care needs Fall: no apparent injuries

## 2012-09-25 DIAGNOSIS — F0390 Unspecified dementia without behavioral disturbance: Secondary | ICD-10-CM

## 2012-09-25 DIAGNOSIS — G20 Parkinson's disease: Secondary | ICD-10-CM

## 2012-09-25 DIAGNOSIS — Z4789 Encounter for other orthopedic aftercare: Secondary | ICD-10-CM

## 2012-09-25 DIAGNOSIS — M810 Age-related osteoporosis without current pathological fracture: Secondary | ICD-10-CM

## 2012-09-25 DIAGNOSIS — F0393 Unspecified dementia, unspecified severity, with mood disturbance: Secondary | ICD-10-CM

## 2012-09-25 DIAGNOSIS — G20A1 Parkinson's disease without dyskinesia, without mention of fluctuations: Secondary | ICD-10-CM

## 2012-09-25 HISTORY — DX: Parkinson's disease without dyskinesia, without mention of fluctuations: G20.A1

## 2012-09-25 HISTORY — DX: Encounter for other orthopedic aftercare: Z47.89

## 2012-09-25 HISTORY — DX: Unspecified dementia without behavioral disturbance: F03.90

## 2012-09-25 HISTORY — DX: Unspecified dementia, unspecified severity, with mood disturbance: F03.93

## 2012-09-25 HISTORY — DX: Age-related osteoporosis without current pathological fracture: M81.0

## 2012-09-25 HISTORY — DX: Parkinson's disease: G20

## 2012-09-29 ENCOUNTER — Non-Acute Institutional Stay (SKILLED_NURSING_FACILITY): Payer: Medicare Other | Admitting: Internal Medicine

## 2012-09-29 DIAGNOSIS — M1612 Unilateral primary osteoarthritis, left hip: Secondary | ICD-10-CM

## 2012-09-29 DIAGNOSIS — E559 Vitamin D deficiency, unspecified: Secondary | ICD-10-CM | POA: Insufficient documentation

## 2012-09-29 DIAGNOSIS — S72009A Fracture of unspecified part of neck of unspecified femur, initial encounter for closed fracture: Secondary | ICD-10-CM

## 2012-09-29 DIAGNOSIS — G20 Parkinson's disease: Secondary | ICD-10-CM

## 2012-09-29 DIAGNOSIS — S72002A Fracture of unspecified part of neck of left femur, initial encounter for closed fracture: Secondary | ICD-10-CM

## 2012-09-29 DIAGNOSIS — F329 Major depressive disorder, single episode, unspecified: Secondary | ICD-10-CM

## 2012-09-29 DIAGNOSIS — M169 Osteoarthritis of hip, unspecified: Secondary | ICD-10-CM

## 2012-09-29 DIAGNOSIS — M1990 Unspecified osteoarthritis, unspecified site: Secondary | ICD-10-CM

## 2012-09-29 DIAGNOSIS — M810 Age-related osteoporosis without current pathological fracture: Secondary | ICD-10-CM

## 2012-09-29 DIAGNOSIS — F0280 Dementia in other diseases classified elsewhere without behavioral disturbance: Secondary | ICD-10-CM

## 2012-09-29 DIAGNOSIS — I2510 Atherosclerotic heart disease of native coronary artery without angina pectoris: Secondary | ICD-10-CM

## 2012-09-29 DIAGNOSIS — I517 Cardiomegaly: Secondary | ICD-10-CM | POA: Insufficient documentation

## 2012-09-29 DIAGNOSIS — K5730 Diverticulosis of large intestine without perforation or abscess without bleeding: Secondary | ICD-10-CM | POA: Insufficient documentation

## 2012-09-29 DIAGNOSIS — I7090 Unspecified atherosclerosis: Secondary | ICD-10-CM

## 2012-09-29 DIAGNOSIS — G3183 Dementia with Lewy bodies: Secondary | ICD-10-CM

## 2012-09-30 ENCOUNTER — Non-Acute Institutional Stay (SKILLED_NURSING_FACILITY): Payer: Medicare Other | Admitting: Internal Medicine

## 2012-09-30 DIAGNOSIS — G20A1 Parkinson's disease without dyskinesia, without mention of fluctuations: Secondary | ICD-10-CM

## 2012-09-30 DIAGNOSIS — R1032 Left lower quadrant pain: Secondary | ICD-10-CM

## 2012-09-30 DIAGNOSIS — G3183 Dementia with Lewy bodies: Secondary | ICD-10-CM

## 2012-09-30 DIAGNOSIS — F0280 Dementia in other diseases classified elsewhere without behavioral disturbance: Secondary | ICD-10-CM

## 2012-09-30 DIAGNOSIS — G20 Parkinson's disease: Secondary | ICD-10-CM

## 2012-09-30 DIAGNOSIS — S72002D Fracture of unspecified part of neck of left femur, subsequent encounter for closed fracture with routine healing: Secondary | ICD-10-CM

## 2012-09-30 DIAGNOSIS — K5900 Constipation, unspecified: Secondary | ICD-10-CM

## 2012-09-30 DIAGNOSIS — N390 Urinary tract infection, site not specified: Secondary | ICD-10-CM

## 2012-10-05 DIAGNOSIS — G20 Parkinson's disease: Secondary | ICD-10-CM | POA: Insufficient documentation

## 2012-10-05 DIAGNOSIS — N390 Urinary tract infection, site not specified: Secondary | ICD-10-CM | POA: Insufficient documentation

## 2012-10-05 DIAGNOSIS — R1032 Left lower quadrant pain: Secondary | ICD-10-CM | POA: Insufficient documentation

## 2012-10-05 DIAGNOSIS — K5900 Constipation, unspecified: Secondary | ICD-10-CM | POA: Insufficient documentation

## 2012-10-05 NOTE — Progress Notes (Signed)
Subjective:     Patient ID: Deborah Hunt, female   DOB: [birth year], 79 y.o.   MRN: [id number]  HPI  10/05/12 c/o left sided abd pain, stated she has not had BM for 2 weeks(not reliable due to her dementia), she was found to have hard stool in her rectum vault, abd was not  guarded and  Has no  rebound tenderness, BS +x4, pain was sudden onset and episodic and spastic in nature, denied nausea/vomiting, she is afebrile today  She has been treated for UTI with Cipro for total 7 days from 09/30/12-denied dysuria or suprapubic tenderness.   A mechanical fall 09/20/12, X-ray 09/20/12 showed s/p L hip ORIF--healed   Dementia: presently taking Aricept, lack of safety awareness.   Parkinson's Disease: Most of the problem is with her gait, tremors are not apparent in hand, takes Sinemet and follow up with Dr. Sandria Manly.   Review of Systems  Constitutional: Negative for activity change, appetite change and fatigue.  HENT: Negative.   Eyes: Negative for pain, redness and visual disturbance.  Respiratory: Negative for cough, shortness of breath and wheezing.   Cardiovascular: Positive for leg swelling.  Gastrointestinal: Positive for abdominal pain and constipation. Negative for nausea, vomiting, diarrhea, blood in stool, abdominal distention, anal bleeding and rectal pain.  Endocrine: Negative.   Genitourinary: Negative for dysuria, urgency, frequency, flank pain and pelvic pain.  Musculoskeletal: Positive for back pain and gait problem.  Skin: Negative.   Allergic/Immunologic: Negative.   Neurological: Negative for tremors, seizures, syncope, speech difficulty and headaches.  Psychiatric/Behavioral: Positive for behavioral problems.       Objective:   Physical Exam  Constitutional: She appears well-nourished.  HENT:  Head: Normocephalic and atraumatic.  Eyes: Conjunctivae and EOM are normal. Pupils are equal, round, and reactive to light.  Neck: Normal range of motion. Neck supple. No thyromegaly  present.  Cardiovascular: Normal rate and regular rhythm.   Murmur heard. Pulmonary/Chest: No respiratory distress. She has no wheezes. She has no rales.  Abdominal: Soft. Bowel sounds are normal. She exhibits no distension and no abdominal bruit. There is no hepatosplenomegaly, splenomegaly or hepatomegaly. There is tenderness in the left upper quadrant and left lower quadrant. There is no rigidity, no rebound, no guarding, no CVA tenderness, no tenderness at McBurney's point and negative Murphy's sign. No hernia. Hernia confirmed negative in the ventral area.    Genitourinary: Guaiac negative stool.  Musculoskeletal: Normal range of motion. She exhibits edema.  Neurological: She is alert. She displays normal reflexes. No cranial nerve deficit. She exhibits normal muscle tone. Coordination normal.  Skin: Skin is warm and dry.  Psychiatric: She has a normal mood and affect. Her behavior is normal.       Assessment:  Fall: no apparent injury, lack of safety awareness and nature of Parkinsonism all contribute to her fall, intensive supervision needed for safety.  Dementia: progressing, SNF care needed    Plan:   . Hip fracture, left Healing nicely   . Dementia with Lewy bodies Continue Aricept and SNF for care for now.   Marland Kitchen Unspecified constipation No reliable HPI due to dementia, will assist the patient onto commode, may give Fleet enema and repeat up to 3x 30 minutes apart as needed, start MiraLax daily tomorrow  . UTI (urinary tract infection) Completed Cipro today, repeat Cath UA and C/S in am   . Abdominal pain, left lower quadrant May KUB, [place] abd, CBC, CMP, TSH, Amylase, Lipase in am if abd pain persists.   Marland Kitchen  Parkinson disease Continue Sinemet.

## 2012-10-22 LAB — BASIC METABOLIC PANEL
Potassium: 3.9 mmol/L (ref 3.4–5.3)
Sodium: 139 mmol/L (ref 137–147)

## 2012-10-22 LAB — CBC AND DIFFERENTIAL
Hemoglobin: 12.5 g/dL (ref 12.0–16.0)
WBC: 6 10^3/mL

## 2012-10-31 ENCOUNTER — Non-Acute Institutional Stay (SKILLED_NURSING_FACILITY): Payer: Medicare Other | Admitting: Nurse Practitioner

## 2012-10-31 DIAGNOSIS — S72002D Fracture of unspecified part of neck of left femur, subsequent encounter for closed fracture with routine healing: Secondary | ICD-10-CM

## 2012-10-31 DIAGNOSIS — R5381 Other malaise: Secondary | ICD-10-CM

## 2012-10-31 DIAGNOSIS — G20A1 Parkinson's disease without dyskinesia, without mention of fluctuations: Secondary | ICD-10-CM

## 2012-10-31 DIAGNOSIS — R5383 Other fatigue: Secondary | ICD-10-CM

## 2012-10-31 DIAGNOSIS — G3183 Dementia with Lewy bodies: Secondary | ICD-10-CM

## 2012-10-31 DIAGNOSIS — F0280 Dementia in other diseases classified elsewhere without behavioral disturbance: Secondary | ICD-10-CM

## 2012-10-31 DIAGNOSIS — G20 Parkinson's disease: Secondary | ICD-10-CM

## 2012-10-31 DIAGNOSIS — K5900 Constipation, unspecified: Secondary | ICD-10-CM

## 2012-10-31 DIAGNOSIS — S72009D Fracture of unspecified part of neck of unspecified femur, subsequent encounter for closed fracture with routine healing: Secondary | ICD-10-CM

## 2012-11-02 DIAGNOSIS — R5381 Other malaise: Secondary | ICD-10-CM | POA: Insufficient documentation

## 2012-11-02 NOTE — Assessment & Plan Note (Signed)
Adjusting well, takes Aricept

## 2012-11-02 NOTE — Assessment & Plan Note (Signed)
Healing nicely.  

## 2012-11-02 NOTE — Assessment & Plan Note (Signed)
Corrected with MiraLax daily       

## 2012-11-02 NOTE — Assessment & Plan Note (Signed)
Not disabling, able to feed herself.

## 2012-11-02 NOTE — Progress Notes (Signed)
Patient ID: Deborah Hunt, female   DOB: 13-Jul-1926, 79 y.o.   MRN: [id number]  Chief Complaint:  Chief Complaint  Patient presents with  . Medical Managment of Chronic Issues    fatigue     HPI:   Problem List Items Addressed This Visit     ICD-9-CM   Hip fracture, left - Primary     Healing nicely.     Dementia with Lewy bodies     Adjusting well, takes Aricept    Unspecified constipation     Corrected with MiraLax daily    Parkinson disease     Not disabling, able to feed herself.     Other malaise and fatigue     CBC, BMP, TSH unremarkable, not sure if her oral intake is adequate--will weight her daily, monitor oral intake closely.        Review of Systems:  Review of Systems  Constitutional: Positive for malaise/fatigue. Negative for fever, chills, weight loss and diaphoresis.  HENT: Positive for hearing loss. Negative for congestion, sore throat and neck pain.   Eyes: Negative for pain, discharge and redness.  Respiratory: Negative for cough, sputum production and wheezing.   Cardiovascular: Negative for chest pain, palpitations, orthopnea, claudication, leg swelling and PND.  Gastrointestinal: Negative for heartburn, nausea, vomiting, abdominal pain, diarrhea, constipation and blood in stool.  Genitourinary: Negative for dysuria, urgency, frequency, hematuria and flank pain.  Musculoskeletal: Positive for joint pain and falls. Negative for myalgias and back pain.  Skin: Negative for itching and rash.       Surgical wound healed.   Neurological: Negative for dizziness, tremors, sensory change, speech change, focal weakness, seizures, loss of consciousness and weakness.  Endo/Heme/Allergies: Negative for environmental allergies and polydipsia. Does not bruise/bleed easily.  Psychiatric/Behavioral: Positive for memory loss. Negative for hallucinations. Depression: flat affect. The patient is not nervous/anxious and does not have insomnia.       Medications: Patient's Medications  New Prescriptions   No medications on file  Previous Medications   ALENDRONATE (FOSAMAX) 70 MG TABLET    Take 70 mg by mouth every 7 (seven) days. Take with a full glass of water on an empty stomach. Monday   BISACODYL (DULCOLAX) 10 MG SUPPOSITORY    Place 1 suppository (10 mg total) rectally daily as needed.   CARBIDOPA-LEVODOPA (SINEMET IR) 25-100 MG PER TABLET    Take 0.5-1.5 tablets by mouth 3 (three) times daily. Pt takes 1.5 tablets with meals. At 3p pt's caregiver gives her 1/2 tab -1 tablet depending on severity of tremors.   CITALOPRAM (CELEXA) 20 MG TABLET    Take 20 mg by mouth daily.   DONEPEZIL (ARICEPT) 10 MG TABLET    Take 10 mg by mouth at bedtime.   ENOXAPARIN (LOVENOX) 40 MG/0.4ML INJECTION    Inject 0.4 mLs (40 mg total) into the skin daily.   FEEDING SUPPLEMENT (ENSURE COMPLETE) LIQD    Take 237 mLs by mouth 2 (two) times daily between meals.   HYDROCODONE-ACETAMINOPHEN (NORCO/VICODIN) 5-325 MG PER TABLET    Take 1-2 tablets by mouth every 6 (six) hours as needed for pain.   MELOXICAM (MOBIC) 15 MG TABLET    Take 15 mg by mouth as needed for pain.   VITAMIN D, ERGOCALCIFEROL, (DRISDOL) 50000 UNITS CAPS    Take 50,000 Units by mouth every 7 (seven) days. Monday  Modified Medications   No medications on file  Discontinued Medications   No medications on file  Physical Exam: Physical Exam  Constitutional: She appears well-nourished.  HENT:  Head: Normocephalic and atraumatic.  Eyes: Conjunctivae and EOM are normal. Pupils are equal, round, and reactive to light.  Neck: Normal range of motion. Neck supple. No JVD present. No thyromegaly present.  Cardiovascular: Normal rate and regular rhythm.   Murmur heard. Pulmonary/Chest: No respiratory distress. She has no wheezes. She has no rales.  Abdominal: Soft. Bowel sounds are normal. She exhibits no distension and no abdominal bruit. There is no hepatosplenomegaly,  splenomegaly or hepatomegaly. There is no tenderness. There is no rigidity, no rebound, no guarding, no CVA tenderness, no tenderness at McBurney's point and negative Murphy's sign. No hernia. Hernia confirmed negative in the ventral area.    Genitourinary: Guaiac negative stool.  Musculoskeletal: Normal range of motion. She exhibits edema.  Lymphadenopathy:    She has no cervical adenopathy.  Neurological: She is alert. She displays normal reflexes. No cranial nerve deficit. She exhibits normal muscle tone. Coordination normal.  Skin: Skin is warm and dry.  Psychiatric: She has a normal mood and affect. Her behavior is normal.     Filed Vitals:   10/31/12 1640  BP: 136/70  Pulse: 79  Temp: 97.6 F (36.4 C)  TempSrc: Tympanic  Resp: 18      Labs reviewed: Basic Metabolic Panel:  Recent Labs  78/29/56 1924 09/15/12 0505 09/16/12 0423 10/11/12 10/22/12  NA 136 134* 137  --  139  K 3.9 3.2* 3.9  --  3.9  CL 105 101 106  --   --   CO2 19 20 22   --   --   GLUCOSE 89 84 93  --   --   BUN 10 10 9   --  22*  CREATININE 0.51 0.50 0.54  --  0.7  CALCIUM 8.5 8.3* 8.1*  --   --   TSH  --   --   --  1.05  --     Liver Function Tests:  Recent Labs  08/19/12 1230  AST 17  ALT <5  ALKPHOS 95  BILITOT 0.5  PROT 7.4  ALBUMIN 4.1    CBC:  Recent Labs  08/19/12 1230 09/13/12 1924 09/14/12 0435 09/15/12 0505 09/16/12 0423 10/22/12  WBC 9.1 14.3* 12.0* 11.2* 9.6 6.0  NEUTROABS 6.5 12.2*  --   --   --   --   HGB 14.9 13.3 13.7 12.5 11.9* 12.5  HCT 43.1 37.4 39.7 36.8 34.1* 36  MCV 90.7 88.4 89.4 89.1 88.3  --   PLT 221 203 220 178 189 218    Anemia Panel: No results found for this basename: IRON, FOLATE, VITAMINB12,  in the last 8760 hours  Significant Diagnostic Results:     Assessment/Plan Hip fracture, left Healing nicely.   Dementia with Lewy bodies Adjusting well, takes Aricept  Unspecified constipation Corrected with MiraLax daily  Parkinson  disease Not disabling, able to feed herself.   Other malaise and fatigue CBC, BMP, TSH unremarkable, not sure if her oral intake is adequate--will weight her daily, monitor oral intake closely.       Family/ staff Communication: monitor weight and oral intake.    Goals of care: SNF   Labs/tests ordered none

## 2012-11-02 NOTE — Assessment & Plan Note (Signed)
CBC, BMP, TSH unremarkable, not sure if her oral intake is adequate--will weight her daily, monitor oral intake closely.

## 2012-11-16 ENCOUNTER — Non-Acute Institutional Stay (SKILLED_NURSING_FACILITY): Payer: Medicare Other | Admitting: Nurse Practitioner

## 2012-11-16 DIAGNOSIS — G20 Parkinson's disease: Secondary | ICD-10-CM

## 2012-11-16 DIAGNOSIS — S72009D Fracture of unspecified part of neck of unspecified femur, subsequent encounter for closed fracture with routine healing: Secondary | ICD-10-CM

## 2012-11-16 DIAGNOSIS — F329 Major depressive disorder, single episode, unspecified: Secondary | ICD-10-CM

## 2012-11-16 DIAGNOSIS — S72002D Fracture of unspecified part of neck of left femur, subsequent encounter for closed fracture with routine healing: Secondary | ICD-10-CM

## 2012-11-16 DIAGNOSIS — K5900 Constipation, unspecified: Secondary | ICD-10-CM

## 2012-11-16 DIAGNOSIS — F3289 Other specified depressive episodes: Secondary | ICD-10-CM

## 2012-11-16 DIAGNOSIS — M810 Age-related osteoporosis without current pathological fracture: Secondary | ICD-10-CM

## 2012-11-16 DIAGNOSIS — G3183 Dementia with Lewy bodies: Secondary | ICD-10-CM

## 2012-11-16 DIAGNOSIS — F0393 Unspecified dementia, unspecified severity, with mood disturbance: Secondary | ICD-10-CM | POA: Insufficient documentation

## 2012-11-16 DIAGNOSIS — F0280 Dementia in other diseases classified elsewhere without behavioral disturbance: Secondary | ICD-10-CM

## 2012-11-16 DIAGNOSIS — G20A1 Parkinson's disease without dyskinesia, without mention of fluctuations: Secondary | ICD-10-CM

## 2012-11-16 NOTE — Assessment & Plan Note (Signed)
Not disabling, able to feed herself. Takes Sinemet 25/100 1+1/2 tab tid and 1/2 at 3pm as needed daily       

## 2012-11-16 NOTE — Assessment & Plan Note (Signed)
Stable on Celexa 20mg      

## 2012-11-16 NOTE — Progress Notes (Signed)
Patient ID: Deborah Hunt, female   DOB: 09/21/1926, 79 y.o.   MRN: [id number]  Chief Complaint:  Chief Complaint  Patient presents with  . Medical Managment of Chronic Issues     HPI:   Problem List Items Addressed This Visit   Hip fracture, left - Primary     Healing nicely. WBAT s/p L hip pinning, takes Tylenol 500mg  tid for pain.       Dementia with Lewy bodies     Adjusting well, takes Aricept      Unspecified constipation     Corrected with MiraLax daily      Osteoporosis, unspecified     Presently taking Alendronate 70mg  weekly, Vit D 50,000units weekly    Depression due to dementia     Stable on Celexa 20mg      Parkinson disease (Chronic)     Not disabling, able to feed herself. Takes Sinemet 25/100 1+1/2 tab tid and 1/2 at 3pm as needed daily         Review of Systems:  Review of Systems  Constitutional: Positive for malaise/fatigue. Negative for fever, chills, weight loss and diaphoresis.  HENT: Positive for hearing loss. Negative for congestion, sore throat and neck pain.   Eyes: Negative for pain, discharge and redness.  Respiratory: Negative for cough, sputum production and wheezing.   Cardiovascular: Negative for chest pain, palpitations, orthopnea, claudication, leg swelling and PND.  Gastrointestinal: Negative for heartburn, nausea, vomiting, abdominal pain, diarrhea, constipation and blood in stool.  Genitourinary: Negative for dysuria, urgency, frequency, hematuria and flank pain.  Musculoskeletal: Positive for joint pain and falls. Negative for myalgias and back pain.  Skin: Negative for itching and rash.       Surgical wound healed.   Neurological: Negative for dizziness, tremors, sensory change, speech change, focal weakness, seizures, loss of consciousness and weakness.  Endo/Heme/Allergies: Negative for environmental allergies and polydipsia. Does not bruise/bleed easily.  Psychiatric/Behavioral: Positive for memory loss. Negative for  hallucinations. Depression: flat affect. The patient is not nervous/anxious and does not have insomnia.      Medications: Patient's Medications  New Prescriptions   No medications on file  Previous Medications   ALENDRONATE (FOSAMAX) 70 MG TABLET    Take 70 mg by mouth every 7 (seven) days. Take with a full glass of water on an empty stomach. Monday   BISACODYL (DULCOLAX) 10 MG SUPPOSITORY    Place 1 suppository (10 mg total) rectally daily as needed.   CARBIDOPA-LEVODOPA (SINEMET IR) 25-100 MG PER TABLET    Take 0.5-1.5 tablets by mouth 3 (three) times daily. Pt takes 1.5 tablets with meals. At 3p pt's caregiver gives her 1/2 tab -1 tablet depending on severity of tremors.   CITALOPRAM (CELEXA) 20 MG TABLET    Take 20 mg by mouth daily.   DONEPEZIL (ARICEPT) 10 MG TABLET    Take 10 mg by mouth at bedtime.   ENOXAPARIN (LOVENOX) 40 MG/0.4ML INJECTION    Inject 0.4 mLs (40 mg total) into the skin daily.   FEEDING SUPPLEMENT (ENSURE COMPLETE) LIQD    Take 237 mLs by mouth 2 (two) times daily between meals.   HYDROCODONE-ACETAMINOPHEN (NORCO/VICODIN) 5-325 MG PER TABLET    Take 1-2 tablets by mouth every 6 (six) hours as needed for pain.   MELOXICAM (MOBIC) 15 MG TABLET    Take 15 mg by mouth as needed for pain.   VITAMIN D, ERGOCALCIFEROL, (DRISDOL) 50000 UNITS CAPS    Take 50,000 Units by mouth every  7 (seven) days. Monday  Modified Medications   No medications on file  Discontinued Medications   No medications on file     Physical Exam: Physical Exam  Constitutional: She appears well-nourished.  HENT:  Head: Normocephalic and atraumatic.  Eyes: Conjunctivae and EOM are normal. Pupils are equal, round, and reactive to light.  Neck: Normal range of motion. Neck supple. No JVD present. No thyromegaly present.  Cardiovascular: Normal rate and regular rhythm.   Murmur heard. Pulmonary/Chest: No respiratory distress. She has no wheezes. She has no rales.  Abdominal: Soft. Bowel sounds  are normal. She exhibits no distension and no abdominal bruit. There is no hepatosplenomegaly, splenomegaly or hepatomegaly. There is no tenderness. There is no rigidity, no rebound, no guarding, no CVA tenderness, no tenderness at McBurney's point and negative Murphy's sign. No hernia. Hernia confirmed negative in the ventral area.  Genitourinary: Guaiac negative stool.  Musculoskeletal: Normal range of motion. She exhibits edema.  Lymphadenopathy:    She has no cervical adenopathy.  Neurological: She is alert. She displays normal reflexes. No cranial nerve deficit. She exhibits normal muscle tone. Coordination normal.  Skin: Skin is warm and dry.  Psychiatric: She has a normal mood and affect. Her behavior is normal.     Filed Vitals:   11/16/12 1252  BP: 120/64  Pulse: 80  Temp: 98.4 F (36.9 C)  TempSrc: Tympanic  Resp: 20      Labs reviewed: Basic Metabolic Panel:  Recent Labs  16/10/96 1924 09/15/12 0505 09/16/12 0423 10/11/12 10/22/12  NA 136 134* 137  --  139  K 3.9 3.2* 3.9  --  3.9  CL 105 101 106  --   --   CO2 19 20 22   --   --   GLUCOSE 89 84 93  --   --   BUN 10 10 9   --  22*  CREATININE 0.51 0.50 0.54  --  0.7  CALCIUM 8.5 8.3* 8.1*  --   --   TSH  --   --   --  1.05  --     Liver Function Tests:  Recent Labs  08/19/12 1230  AST 17  ALT <5  ALKPHOS 95  BILITOT 0.5  PROT 7.4  ALBUMIN 4.1    CBC:  Recent Labs  08/19/12 1230 09/13/12 1924 09/14/12 0435 09/15/12 0505 09/16/12 0423 10/22/12  WBC 9.1 14.3* 12.0* 11.2* 9.6 6.0  NEUTROABS 6.5 12.2*  --   --   --   --   HGB 14.9 13.3 13.7 12.5 11.9* 12.5  HCT 43.1 37.4 39.7 36.8 34.1* 36  MCV 90.7 88.4 89.4 89.1 88.3  --   PLT 221 203 220 178 189 218    Anemia Panel: No results found for this basename: IRON, FOLATE, VITAMINB12,  in the last 8760 hours  Significant Diagnostic Results:     Assessment/Plan Hip fracture, left Healing nicely. WBAT s/p L hip pinning, takes Tylenol  500mg  tid for pain.     Osteoporosis, unspecified Presently taking Alendronate 70mg  weekly, Vit D 50,000units weekly  Unspecified constipation Corrected with MiraLax daily    Depression due to dementia Stable on Celexa 20mg    Parkinson disease Not disabling, able to feed herself. Takes Sinemet 25/100 1+1/2 tab tid and 1/2 at 3pm as needed daily    Dementia with Lewy bodies Adjusting well, takes Aricept        Family/ staff Communication: safety   Goals of care: SNF  Labs/tests ordered none

## 2012-11-16 NOTE — Assessment & Plan Note (Signed)
Adjusting well, takes Aricept 

## 2012-11-16 NOTE — Assessment & Plan Note (Signed)
Corrected with MiraLax daily

## 2012-11-16 NOTE — Assessment & Plan Note (Signed)
Presently taking Alendronate 70mg  weekly, Vit D 50,000units weekly

## 2012-11-16 NOTE — Assessment & Plan Note (Addendum)
Healing nicely. WBAT s/p L hip pinning, takes Tylenol 500mg  tid for pain.

## 2012-11-30 ENCOUNTER — Non-Acute Institutional Stay (SKILLED_NURSING_FACILITY): Payer: Medicare Other | Admitting: Nurse Practitioner

## 2012-11-30 DIAGNOSIS — F0280 Dementia in other diseases classified elsewhere without behavioral disturbance: Secondary | ICD-10-CM

## 2012-11-30 DIAGNOSIS — F329 Major depressive disorder, single episode, unspecified: Secondary | ICD-10-CM

## 2012-11-30 DIAGNOSIS — G3183 Dementia with Lewy bodies: Secondary | ICD-10-CM

## 2012-11-30 DIAGNOSIS — G20 Parkinson's disease: Secondary | ICD-10-CM

## 2012-11-30 DIAGNOSIS — K5900 Constipation, unspecified: Secondary | ICD-10-CM

## 2012-11-30 NOTE — Assessment & Plan Note (Signed)
Corrected with MiraLax daily       

## 2012-11-30 NOTE — Assessment & Plan Note (Signed)
Crying episodes, talks of killing self because she has to sit in activity room when her spouse is not present, the resident was told she requires supervision at all times. Will taper her off Celexa and try Cymbalta.

## 2012-11-30 NOTE — Progress Notes (Signed)
Patient ID: Deborah Hunt, female   DOB: January 07, 1927, 79 y.o.   MRN: [id number]  Chief Complaint:  Chief Complaint  Patient presents with  . Medical Managment of Chronic Issues    depression     HPI:   Problem List Items Addressed This Visit   Parkinson disease (Chronic)     Not disabling, able to feed herself. Takes Sinemet 25/100 1+1/2 tab tid and 1/2 at 3pm as needed daily        Dementia with Lewy bodies     Adjusting well, takes Aricept        Unspecified constipation     Corrected with MiraLax daily        Depression due to dementia - Primary     Crying episodes, talks of killing self because she has to sit in activity room when her spouse is not present, the resident was told she requires supervision at all times. Will taper her off Celexa and try Cymbalta.          Review of Systems:  Review of Systems  Constitutional: Positive for malaise/fatigue. Negative for fever, chills, weight loss and diaphoresis.  HENT: Positive for hearing loss. Negative for congestion, sore throat and neck pain.   Eyes: Negative for pain, discharge and redness.  Respiratory: Negative for cough, sputum production and wheezing.   Cardiovascular: Negative for chest pain, palpitations, orthopnea, claudication, leg swelling and PND.  Gastrointestinal: Negative for heartburn, nausea, vomiting, abdominal pain, diarrhea, constipation and blood in stool.  Genitourinary: Negative for dysuria, urgency, frequency, hematuria and flank pain.  Musculoskeletal: Positive for joint pain and falls. Negative for myalgias and back pain.  Skin: Negative for itching and rash.       Surgical wound healed.   Neurological: Negative for dizziness, tremors, sensory change, speech change, focal weakness, seizures, loss of consciousness and weakness.  Endo/Heme/Allergies: Negative for environmental allergies and polydipsia. Does not bruise/bleed easily.  Psychiatric/Behavioral: Positive for memory loss.  Negative for hallucinations. Depression: flat affect. New behviros--crying episodes.  The patient is not nervous/anxious and does not have insomnia.      Medications: Patient's Medications  New Prescriptions   No medications on file  Previous Medications   ALENDRONATE (FOSAMAX) 70 MG TABLET    Take 70 mg by mouth every 7 (seven) days. Take with a full glass of water on an empty stomach. Monday   BISACODYL (DULCOLAX) 10 MG SUPPOSITORY    Place 1 suppository (10 mg total) rectally daily as needed.   CARBIDOPA-LEVODOPA (SINEMET IR) 25-100 MG PER TABLET    Take 0.5-1.5 tablets by mouth 3 (three) times daily. Pt takes 1.5 tablets with meals. At 3p pt's caregiver gives her 1/2 tab -1 tablet depending on severity of tremors.   CITALOPRAM (CELEXA) 20 MG TABLET    Take 20 mg by mouth daily.   DONEPEZIL (ARICEPT) 10 MG TABLET    Take 10 mg by mouth at bedtime.   ENOXAPARIN (LOVENOX) 40 MG/0.4ML INJECTION    Inject 0.4 mLs (40 mg total) into the skin daily.   FEEDING SUPPLEMENT (ENSURE COMPLETE) LIQD    Take 237 mLs by mouth 2 (two) times daily between meals.   HYDROCODONE-ACETAMINOPHEN (NORCO/VICODIN) 5-325 MG PER TABLET    Take 1-2 tablets by mouth every 6 (six) hours as needed for pain.   MELOXICAM (MOBIC) 15 MG TABLET    Take 15 mg by mouth as needed for pain.   VITAMIN D, ERGOCALCIFEROL, (DRISDOL) 50000 UNITS CAPS  Take 50,000 Units by mouth every 7 (seven) days. Monday  Modified Medications   No medications on file  Discontinued Medications   No medications on file     Physical Exam: Physical Exam  Constitutional: She appears well-nourished.  HENT:  Head: Normocephalic and atraumatic.  Eyes: Conjunctivae and EOM are normal. Pupils are equal, round, and reactive to light.  Neck: Normal range of motion. Neck supple. No JVD present. No thyromegaly present.  Cardiovascular: Normal rate and regular rhythm.   Murmur heard. Pulmonary/Chest: No respiratory distress. She has no wheezes. She  has no rales.  Abdominal: Soft. Bowel sounds are normal. She exhibits no distension and no abdominal bruit. There is no hepatosplenomegaly, splenomegaly or hepatomegaly. There is no tenderness. There is no rigidity, no rebound, no guarding, no CVA tenderness, no tenderness at McBurney's point and negative Murphy's sign. No hernia. Hernia confirmed negative in the ventral area.  Genitourinary: Guaiac negative stool.  Musculoskeletal: Normal range of motion. She exhibits edema.  Lymphadenopathy:    She has no cervical adenopathy.  Neurological: She is alert. She displays normal reflexes. No cranial nerve deficit. She exhibits normal muscle tone. Coordination normal.  Skin: Skin is warm and dry.  Psychiatric: She has a normal mood and affect. Her behavior is normal.     Filed Vitals:   11/30/12 1708  BP: 120/64  Pulse: 80  Temp: 98.4 F (36.9 C)  Resp: 20      Labs reviewed: Basic Metabolic Panel:  Recent Labs  16/10/96 1924 09/15/12 0505 09/16/12 0423 10/11/12 10/22/12  NA 136 134* 137  --  139  K 3.9 3.2* 3.9  --  3.9  CL 105 101 106  --   --   CO2 19 20 22   --   --   GLUCOSE 89 84 93  --   --   BUN 10 10 9   --  22*  CREATININE 0.51 0.50 0.54  --  0.7  CALCIUM 8.5 8.3* 8.1*  --   --   TSH  --   --   --  1.05  --     Liver Function Tests:  Recent Labs  08/19/12 1230  AST 17  ALT <5  ALKPHOS 95  BILITOT 0.5  PROT 7.4  ALBUMIN 4.1    CBC:  Recent Labs  08/19/12 1230 09/13/12 1924 09/14/12 0435 09/15/12 0505 09/16/12 0423 10/22/12  WBC 9.1 14.3* 12.0* 11.2* 9.6 6.0  NEUTROABS 6.5 12.2*  --   --   --   --   HGB 14.9 13.3 13.7 12.5 11.9* 12.5  HCT 43.1 37.4 39.7 36.8 34.1* 36  MCV 90.7 88.4 89.4 89.1 88.3  --   PLT 221 203 220 178 189 218    Anemia Panel: No results found for this basename: IRON, FOLATE, VITAMINB12,  in the last 8760 hours  Significant Diagnostic Results:     Assessment/Plan Depression due to dementia Crying episodes, talks  of killing self because she has to sit in activity room when her spouse is not present, the resident was told she requires supervision at all times. Will taper her off Celexa and try Cymbalta.     Parkinson disease Not disabling, able to feed herself. Takes Sinemet 25/100 1+1/2 tab tid and 1/2 at 3pm as needed daily      Dementia with Lewy bodies Adjusting well, takes Aricept      Unspecified constipation Corrected with MiraLax daily          Family/  staff Communication: observe the patient.    Goals of care: SNF   Labs/tests ordered none.

## 2012-11-30 NOTE — Assessment & Plan Note (Signed)
Not disabling, able to feed herself. Takes Sinemet 25/100 1+1/2 tab tid and 1/2 at 3pm as needed daily

## 2012-11-30 NOTE — Assessment & Plan Note (Signed)
Adjusting well, takes Aricept 

## 2012-12-13 ENCOUNTER — Encounter: Payer: Self-pay | Admitting: *Deleted

## 2013-01-12 ENCOUNTER — Non-Acute Institutional Stay (SKILLED_NURSING_FACILITY): Payer: Medicare Other | Admitting: Nurse Practitioner

## 2013-01-12 ENCOUNTER — Encounter: Payer: Self-pay | Admitting: Nurse Practitioner

## 2013-01-12 DIAGNOSIS — F329 Major depressive disorder, single episode, unspecified: Secondary | ICD-10-CM

## 2013-01-12 DIAGNOSIS — F0280 Dementia in other diseases classified elsewhere without behavioral disturbance: Secondary | ICD-10-CM

## 2013-01-12 DIAGNOSIS — G20 Parkinson's disease: Secondary | ICD-10-CM

## 2013-01-12 DIAGNOSIS — K5900 Constipation, unspecified: Secondary | ICD-10-CM

## 2013-01-12 DIAGNOSIS — G3183 Dementia with Lewy bodies: Secondary | ICD-10-CM

## 2013-01-12 DIAGNOSIS — M169 Osteoarthritis of hip, unspecified: Secondary | ICD-10-CM

## 2013-01-12 DIAGNOSIS — Z66 Do not resuscitate: Secondary | ICD-10-CM

## 2013-01-12 DIAGNOSIS — M1612 Unilateral primary osteoarthritis, left hip: Secondary | ICD-10-CM

## 2013-01-12 NOTE — Assessment & Plan Note (Signed)
Not disabling, able to feed herself. Takes Sinemet 25/100 1+1/2 tab tid and 1/2 at 3pm as needed daily

## 2013-01-12 NOTE — Progress Notes (Signed)
Patient ID: Deborah Hunt, female   DOB: 02/27/1927, 79 y.o.   MRN: [id number] Code Status: DNR  Allergies  Allergen Reactions  . Lidocaine   . Other Other (See Comments)    Novocaine  . Penicillins   . Sulfa Antibiotics     Chief Complaint  Patient presents with  . Medical Managment of Chronic Issues    HPI: Patient is a 79 y.o. female seen in the SNF at [facility] today for evaluation of her chronic medical conditions.  Problem List Items Addressed This Visit   Dementia with Lewy bodies     Adjusting well, takes Aricept, a candidate for Memory Care Unit.           Depression due to dementia     Crying episodes, talks of killing self because she has to sit in activity room when her spouse is not present. Tapered off Celexa, failed Cymbalta(hallucination). Improved on Mirtazapine 7.5mg  nightly started 12/15/12        DNR (do not resuscitate) - Primary   Osteoarthritis of left hip     Healed left hip fx--pain is managed with Tylenol 500mg  tid. Limited ambulation with assistance of gait belt and walker    Parkinson disease (Chronic)     Not disabling, able to feed herself. Takes Sinemet 25/100 1+1/2 tab tid and 1/2 at 3pm as needed daily          Unspecified constipation     Corrected with MiraLax daily             Review of Systems:  Review of Systems  Constitutional: Negative for fever, chills, weight loss, malaise/fatigue and diaphoresis.  HENT: Positive for hearing loss. Negative for congestion, sore throat and neck pain.   Eyes: Negative for pain, discharge and redness.  Respiratory: Negative for cough, sputum production and wheezing.   Cardiovascular: Negative for chest pain, palpitations, orthopnea, claudication, leg swelling and PND.  Gastrointestinal: Negative for heartburn, nausea, vomiting, abdominal pain, diarrhea, constipation and blood in stool.  Genitourinary: Negative for dysuria, urgency, frequency, hematuria and flank pain.   Musculoskeletal: Positive for joint pain and falls. Negative for myalgias and back pain.  Skin: Negative for itching and rash.       Surgical wound healed.   Neurological: Negative for dizziness, tremors, sensory change, speech change, focal weakness, seizures, loss of consciousness and weakness.  Endo/Heme/Allergies: Negative for environmental allergies and polydipsia. Does not bruise/bleed easily.  Psychiatric/Behavioral: Positive for memory loss. Negative for hallucinations. Depression: flat affect. New behviros--crying episodes.  The patient is not nervous/anxious and does not have insomnia.      Past Medical History  Diagnosis Date  . Senile dementia with depressive features 09/25/2012  . GERD (gastroesophageal reflux disease)   . Diverticulosis   . Arthritis   . Osteoporosis   . Complication of anesthesia     caused brain disease  . Paralysis agitans 09/25/2012  . Unspecified constipation 09/25/2012  . Pain in joint, pelvic region and thigh 09/25/2012.  Marland Kitchen Senile osteoporosis 09/25/2012  . Other orthopedic aftercare(V54.89) 09/25/2012   Past Surgical History  Procedure Laterality Date  . Tubal ligation    . Nipple removal      from left breast  . Abdominal hysterectomy    . Eye surgery      bilateral cataract surgery  . Hip pinning,cannulated Left 09/14/2012    Procedure: CANNULATED HIP PINNING;  Surgeon: [name], MD;  Location: [facility];  Service: Orthopedics;  Laterality:  Left;   Social History:   reports that she quit smoking about 20 years ago. She has never used smokeless tobacco. She reports that she does not drink alcohol or use illicit drugs.  Family History  Problem Relation Age of Onset  . Cancer Sister   . Cancer Mother   . Heart attack Father     Medications: Reviewed at [facility]   Physical Exam: Physical Exam  Constitutional: She appears well-nourished.  HENT:  Head: Normocephalic and atraumatic.  Eyes: Conjunctivae and EOM are normal. Pupils  are equal, round, and reactive to light.  Neck: Normal range of motion. Neck supple. No JVD present. No thyromegaly present.  Cardiovascular: Normal rate and regular rhythm.   Murmur heard. Pulmonary/Chest: No respiratory distress. She has no wheezes. She has no rales.  Abdominal: Soft. Bowel sounds are normal. She exhibits no distension and no abdominal bruit. There is no hepatosplenomegaly, splenomegaly or hepatomegaly. There is no tenderness. There is no rigidity, no rebound, no guarding, no CVA tenderness, no tenderness at McBurney's point and negative Murphy's sign. No hernia. Hernia confirmed negative in the ventral area.  Genitourinary: Guaiac negative stool.  Musculoskeletal: Normal range of motion. She exhibits no edema.  Lymphadenopathy:    She has no cervical adenopathy.  Neurological: She is alert. She displays normal reflexes. No cranial nerve deficit. She exhibits normal muscle tone. Coordination normal.  Skin: Skin is warm and dry.  Psychiatric: Her behavior is normal. Her mood appears not anxious. Her affect is not angry, not blunt, not labile and not inappropriate. Her speech is rapid and/or pressured. Her speech is not delayed, not tangential and not slurred. She is not agitated, not aggressive, not hyperactive, not slowed, not withdrawn, not actively hallucinating and not combative. Thought content is not paranoid and not delusional. Cognition and memory are impaired. She exhibits a depressed mood. She is communicative. She exhibits abnormal recent memory. She is attentive.    Filed Vitals:   01/12/13 1326  BP: 124/60  Pulse: 68  Temp: 96.2 F (35.7 C)  TempSrc: Tympanic  Resp: 18      Labs reviewed: Basic Metabolic Panel:  Recent Labs  16/10/96 1924 09/15/12 0505 09/16/12 0423 10/11/12 10/22/12  NA 136 134* 137  --  139  K 3.9 3.2* 3.9  --  3.9  CL 105 101 106  --   --   CO2 19 20 22   --   --   GLUCOSE 89 84 93  --   --   BUN 10 10 9   --  22*  CREATININE  0.51 0.50 0.54  --  0.7  CALCIUM 8.5 8.3* 8.1*  --   --   TSH  --   --   --  1.05  --    Liver Function Tests:  Recent Labs  08/19/12 1230  AST 17  ALT <5  ALKPHOS 95  BILITOT 0.5  PROT 7.4  ALBUMIN 4.1    CBC:  Recent Labs  08/19/12 1230 09/13/12 1924 09/14/12 0435 09/15/12 0505 09/16/12 0423 10/22/12  WBC 9.1 14.3* 12.0* 11.2* 9.6 6.0  NEUTROABS 6.5 12.2*  --   --   --   --   HGB 14.9 13.3 13.7 12.5 11.9* 12.5  HCT 43.1 37.4 39.7 36.8 34.1* 36  MCV 90.7 88.4 89.4 89.1 88.3  --   PLT 221 203 220 178 189 218        Assessment/Plan Unspecified constipation Corrected with MiraLax daily  Osteoarthritis of left hip Healed left hip fx--pain is managed with Tylenol 500mg  tid. Limited ambulation with assistance of gait belt and walker  Parkinson disease Not disabling, able to feed herself. Takes Sinemet 25/100 1+1/2 tab tid and 1/2 at 3pm as needed daily        Dementia with Lewy bodies Adjusting well, takes Aricept, a candidate for Memory Care Unit.         Depression due to dementia Crying episodes, talks of killing self because she has to sit in activity room when her spouse is not present. Tapered off Celexa, failed Cymbalta(hallucination). Improved on Mirtazapine 7.5mg  nightly started 12/15/12        Family/ Staff Communication:  Observe the patient.   Goals of Care: SNF Memory Care Unit  Labs/tests ordered: none

## 2013-01-12 NOTE — Assessment & Plan Note (Signed)
Healed left hip fx--pain is managed with Tylenol 500mg  tid. Limited ambulation with assistance of gait belt and walker

## 2013-01-12 NOTE — Assessment & Plan Note (Signed)
Adjusting well, takes Aricept, a candidate for Memory Care Unit.

## 2013-01-12 NOTE — Assessment & Plan Note (Signed)
Corrected with MiraLax daily

## 2013-01-12 NOTE — Assessment & Plan Note (Signed)
Crying episodes, talks of killing self because she has to sit in activity room when her spouse is not present. Tapered off Celexa, failed Cymbalta(hallucination). Improved on Mirtazapine 7.5mg  nightly started 12/15/12

## 2013-02-15 ENCOUNTER — Ambulatory Visit (INDEPENDENT_AMBULATORY_CARE_PROVIDER_SITE_OTHER): Payer: Medicare Other | Admitting: Diagnostic Neuroimaging

## 2013-02-15 ENCOUNTER — Encounter: Payer: Self-pay | Admitting: Diagnostic Neuroimaging

## 2013-02-15 VITALS — BP 126/74 | HR 80 | Ht 59.0 in | Wt 85.0 lb

## 2013-02-15 DIAGNOSIS — G3183 Dementia with Lewy bodies: Secondary | ICD-10-CM

## 2013-02-15 NOTE — Progress Notes (Signed)
[facility]  PATIENT: Deborah Hunt DOB: 1927/03/29  REFERRING CLINICIAN:  HISTORY FROM: patient and husband REASON FOR VISIT: follow up   HISTORICAL  CHIEF COMPLAINT:  Chief Complaint  Patient presents with  . Follow-up    HISTORY OF PRESENT ILLNESS:   UPDATE 02/15/13: Since last visit, patient fell and broke her left hip. This required admission and surgery. Since that time she's been living in skilled nursing facility. Patient's dementia and Parkinson's are progressing. She's having more hallucinations and vivid dreaming. She's having severe depression.  UPDATE 08/09/12: Since last visit, her son now dx'd with dementia at age 30 yrs old. Patient having int crying spells. More tremor / wearing off around 3-4pm. No falls. Memory stable.   UPDATE 04/13/12: Doing the same. Tremor is better on carb/levo. Gait and memory are stable. Some constipation. Not using cane. Larey Seat recently (?tripped on bath robe).  UPDATE 03/02/12: Progressive gait and energy decline. Now with more resting tremor. Tried citalopram and wellbutrin for depression without relief. Now tapering off anti-depressants. Asking about diagnosis. Now admits to having auditory hallucinations (hearing music that is not there) and visual (seeing flashes of something not there).  UPDATE 12/04/11: Had some increased postural and action tremors 2 weeks ago, but now better. Struggles with depression, decr mood, decr energy. tolerating donepezil.  UPDATE 10/21/11: Doing about the same. No falls. Answered questions re: diagnosis, prognosis, treatment. Feels that memory/cognitive issues more severe than balance.   PRIOR HPI: 79 year old right-handed female with history of depression, anxiety, Paget's disease of the breast, here for evaluation of balance and memory difficulty for the past one to 2 years.  Patient reports gradual progressive balance and walking difficulties starting about a year ago.  She recalls  walking in the park, coming to a set of steps and feeling uncomfortable going down the steps without a railing.  She turned around the back to her car.  She also has noticed some word finding difficulties and short-term memory loss over the past one to 2 years.  Her husband confirms these findings of a he thinks the problem is been going on for little bit longer.  He noticed some changes around the time of her lumpectomy surgery around 2011.  Over the past 6-12 months, patient has quit driving, cooking, household chores and taking care of her own finances.  She stopped these activities because she felt she was unable to do them safely.  REVIEW OF SYSTEMS: Full 14 system review of systems performed and notable only for weight loss hearing loss depression decreased energy weakness memory loss.  ALLERGIES: Allergies  Allergen Reactions  . Lidocaine   . Other Other (See Comments)    Novocaine  . Penicillins   . Sulfa Antibiotics     HOME MEDICATIONS: Prior to Admission medications   Medication Sig Start Date End Date Taking? Authorizing Provider  bisacodyl (DULCOLAX) 10 MG suppository Place 1 suppository (10 mg total) rectally daily as needed. 09/16/12  Yes Nishant Dhungel, MD  Calcium & Magnesium Carbonates (MYLANTA PO) Take 30 mL by mouth every 4 (four) hours as needed.   Yes Historical Provider, MD  carbidopa-levodopa (SINEMET IR) 25-100 MG per tablet Take 0.5-1.5 tablets by mouth 3 (three) times daily. Pt takes 1.5 tablets with meals. At 3p pt's caregiver gives her 1/2 tab -1 tablet depending on severity of tremors/Parkinson disease.   Yes Historical Provider, MD  donepezil (ARICEPT) 10 MG tablet Take 10 mg by mouth at bedtime. Take 1 tablet  once daily at bedtime.   Yes Historical Provider, MD  feeding supplement (RESOURCE BREEZE) LIQD Take 1 Container by mouth 2 (two) times daily.   Yes Historical Provider, MD  HYDROcodone-acetaminophen (NORCO/VICODIN) 5-325 MG per tablet Take 1-2 tablets by  mouth every 6 (six) hours as needed for pain. 09/14/12  Yes Alexzandrew Julien Girt, PA-C  meloxicam (MOBIC) 15 MG tablet Take 15 mg by mouth as needed for pain.   Yes Historical Provider, MD  mirtazapine (REMERON) 7.5 MG tablet Take 1 tablet by mouth at bedtime. 02/07/13  Yes Historical Provider, MD  Nutritional Supplements (BOOST PO) Take 1 Can by mouth 2 (two) times daily.   Yes Historical Provider, MD  polyethylene glycol powder (GLYCOLAX/MIRALAX) powder Take 17 g by mouth daily. 02/02/13  Yes Historical Provider, MD  Vitamin D, Ergocalciferol, (DRISDOL) 50000 UNITS CAPS Take 50,000 Units by mouth every 7 (seven) days. Monday- Take 1 by mouth weekly for vitamin deficiency.   Yes Historical Provider, MD   Outpatient Prescriptions Prior to Visit  Medication Sig Dispense Refill  . bisacodyl (DULCOLAX) 10 MG suppository Place 1 suppository (10 mg total) rectally daily as needed.  12 suppository  0  . carbidopa-levodopa (SINEMET IR) 25-100 MG per tablet Take 0.5-1.5 tablets by mouth 3 (three) times daily. Pt takes 1.5 tablets with meals. At 3p pt's caregiver gives her 1/2 tab -1 tablet depending on severity of tremors/Parkinson disease.      . donepezil (ARICEPT) 10 MG tablet Take 10 mg by mouth at bedtime. Take 1 tablet once daily at bedtime.      Marland Kitchen HYDROcodone-acetaminophen (NORCO/VICODIN) 5-325 MG per tablet Take 1-2 tablets by mouth every 6 (six) hours as needed for pain.  60 tablet  0  . meloxicam (MOBIC) 15 MG tablet Take 15 mg by mouth as needed for pain.      . Vitamin D, Ergocalciferol, (DRISDOL) 50000 UNITS CAPS Take 50,000 Units by mouth every 7 (seven) days. Monday- Take 1 by mouth weekly for vitamin deficiency.      Marland Kitchen alendronate (FOSAMAX) 70 MG tablet Take 70 mg by mouth every 7 (seven) days. Take with a full glass of water on an empty stomach. Monday  Take 70 mg every week by mouth for osteoporosis.      . citalopram (CELEXA) 20 MG tablet Take 20 mg by mouth daily.      Marland Kitchen enoxaparin (LOVENOX)  40 MG/0.4ML injection Inject 0.4 mLs (40 mg total) into the skin daily.  10 Syringe  0  . feeding supplement (ENSURE COMPLETE) LIQD Take 237 mLs by mouth 2 (two) times daily between meals.  30 Bottle  0   Facility-Administered Medications Prior to Visit  Medication Dose Route Frequency Provider Last Rate Last Dose  . ceFAZolin (ANCEF) IVPB 2 g/50 mL premix   Intravenous PRN Angela Adam, CRNA   2 g at 09/14/12 1610    PAST MEDICAL HISTORY: Past Medical History  Diagnosis Date  . Senile dementia with depressive features 09/25/2012  . GERD (gastroesophageal reflux disease)   . Diverticulosis   . Arthritis   . Osteoporosis   . Complication of anesthesia     caused brain disease  . Paralysis agitans 09/25/2012  . Unspecified constipation 09/25/2012  . Pain in joint, pelvic region and thigh 09/25/2012.  Marland Kitchen Senile osteoporosis 09/25/2012  . Other orthopedic aftercare(V54.89) 09/25/2012    PAST SURGICAL HISTORY: Past Surgical History  Procedure Laterality Date  . Tubal ligation    . Nipple removal  from left breast  . Abdominal hysterectomy    . Eye surgery      bilateral cataract surgery  . Hip pinning,cannulated Left 09/14/2012    Procedure: CANNULATED HIP PINNING;  Surgeon: [name], MD;  Location: [facility];  Service: Orthopedics;  Laterality: Left;    FAMILY HISTORY: Family History  Problem Relation Age of Onset  . Cancer Sister   . Cancer Mother   . Heart attack Father     SOCIAL HISTORY:  History   Social History  . Marital Status: Married    Spouse Name: N/A    Number of Children: 2  . Years of Education: College   Occupational History  . Not on file.   Social History Main Topics  . Smoking status: Former Smoker -- 0.50 packs/day for 30 years    Quit date: 09/13/1992  . Smokeless tobacco: Never Used  . Alcohol Use: No     Comment: occasionally  . Drug Use: No  . Sexual Activity: Not on file   Other Topics Concern  . Not on file   Social  History Narrative   Lives at home with spouse.   Caffeine Use: 1.5 cups of coffee daily     PHYSICAL EXAM  Filed Vitals:   02/15/13 1505  BP: 126/74  Pulse: 80  Height: 4\' 11"  (1.499 m)  Weight: 85 lb (38.556 kg)    Not recorded    Body mass index is 17.16 kg/(m^2).  GENERAL EXAM: General: Patient is awake, alert and in no acute distress.  Well developed and groomed. Cardiovascular: No carotid artery bruits.  Heart is regular rate and rhythm with no murmurs.  Neurologic Exam  Mental Status: Awake, DEPRESSED, CRYING. MMSE 18/30. Cranial Nerves: Pupils are equal and reactive to light.  Visual fields are full to confrontation.  Conjugate eye movements are full and symmetric.  Facial sensation and strength are symmetric.  Hearing is intact.  Palate elevated symmetrically and uvula is midline.  Shoulder shrug is symmetric.  Tongue is midline. Motor: MILD REST TREMOR OF BUE. MILD POSTURAL TREMOR OF BUE. MILD COGWHEELING. MILD BRADYKINESIA. Normal bulk. DIFFUSE 4/5 STRENGTH. LEFT LEG PAIN. Sensory: Intact and symmetric to light touch. Coordination: No ataxia or dysmetria on finger-nose or rapid alternating movement testing. Gait and Station: IN [place].  DIAGNOSTIC DATA (LABS, IMAGING, TESTING) - I reviewed patient records, labs, notes, testing and imaging myself where available.  Lab Results  Component Value Date   WBC 6.0 10/22/2012   HGB 12.5 10/22/2012   HCT 36 10/22/2012   MCV 88.3 09/16/2012   PLT 218 10/22/2012      Component Value Date/Time   NA 139 10/22/2012   NA 137 09/16/2012 0423   K 3.9 10/22/2012   CL 106 09/16/2012 0423   CO2 22 09/16/2012 0423   GLUCOSE 93 09/16/2012 0423   BUN 22* 10/22/2012   BUN 9 09/16/2012 0423   CREATININE 0.7 10/22/2012   CREATININE 0.54 09/16/2012 0423   CALCIUM 8.1* 09/16/2012 0423   PROT 7.4 08/19/2012 1230   ALBUMIN 4.1 08/19/2012 1230   AST 17 08/19/2012 1230   ALT <5 08/19/2012 1230   ALKPHOS 95 08/19/2012 1230   BILITOT 0.5 08/19/2012  1230   GFRNONAA 85* 09/16/2012 0423   GFRAA >90 09/16/2012 0423   No results found for this basename: CHOL, HDL, LDLCALC, LDLDIRECT, TRIG, CHOLHDL   No results found for this basename: HGBA1C   No results found for this basename: VITAMINB12  Lab Results  Component Value Date   TSH 1.05 10/11/2012    09/29/11 MRI brain - moderate perisylvian and temporal atrophy; severe chronic small vessel ischemic disease.  TSH, B12 - normal   ASSESSMENT AND PLAN  79 y.o. FEMALE WITH MEMORY PROBLEMS, GAIT DIFFICULTY, INT TREMORS.  mmse (19-->25-->18). Positive frontal release signs. Has resting tremor, cogwheeling, bradykinesia, gait instability, visual and auditory hallucinations.   Dx: dementia with lewy bodies  PLAN: 1. Continue donepezil 10mg  daily 2. Continue carb/levo up to 6 tabs per day as tolerated 3. Add Namenda XR   Return in about 1 year (around 02/15/2014) for with Heide Guile or Kamarri Lovvorn.    Suanne Marker, MD 02/15/2013, 3:56 PM Certified in Neurology, Neurophysiology and Neuroimaging  [facility] [address] [place], [place] [address] [phone]

## 2013-02-23 ENCOUNTER — Non-Acute Institutional Stay (SKILLED_NURSING_FACILITY): Payer: Medicare Other | Admitting: Nurse Practitioner

## 2013-02-23 ENCOUNTER — Encounter: Payer: Self-pay | Admitting: Nurse Practitioner

## 2013-02-23 DIAGNOSIS — M169 Osteoarthritis of hip, unspecified: Secondary | ICD-10-CM

## 2013-02-23 DIAGNOSIS — F0393 Unspecified dementia, unspecified severity, with mood disturbance: Secondary | ICD-10-CM

## 2013-02-23 DIAGNOSIS — F0280 Dementia in other diseases classified elsewhere without behavioral disturbance: Secondary | ICD-10-CM

## 2013-02-23 DIAGNOSIS — K5900 Constipation, unspecified: Secondary | ICD-10-CM

## 2013-02-23 DIAGNOSIS — G20 Parkinson's disease: Secondary | ICD-10-CM

## 2013-02-23 DIAGNOSIS — F329 Major depressive disorder, single episode, unspecified: Secondary | ICD-10-CM

## 2013-02-23 DIAGNOSIS — M1612 Unilateral primary osteoarthritis, left hip: Secondary | ICD-10-CM

## 2013-02-23 DIAGNOSIS — G20A1 Parkinson's disease without dyskinesia, without mention of fluctuations: Secondary | ICD-10-CM

## 2013-02-23 NOTE — Progress Notes (Signed)
Patient ID: Deborah Hunt, female   DOB: [birth year], 79 y.o.   MRN: [id number] Code Status: DNR  Allergies  Allergen Reactions  . Lidocaine   . Other Other (See Comments)    Novocaine  . Penicillins   . Sulfa Antibiotics     Chief Complaint  Patient presents with  . Medical Managment of Chronic Issues    HPI: Patient is a 79 y.o. female seen in the SNF at [facility] today for evaluation of her chronic medical conditions.  Problem List Items Addressed This Visit   Dementia with Lewy bodies     Doing well in Memory Care Unit,  takes Aricept 10mg  daily.             Depression due to dementia     Better managed with Mirtazapine 7.5mg  nightly.         Osteoarthritis of left hip     Healed left hip fx--pain is managed with Tylenol 500mg  tid-able to ambulate with walker independently now. Meloxicam 15mg  daily prn available to her.       Relevant Medications      acetaminophen (TYLENOL) 500 MG tablet   Parkinson disease (Chronic)     Not disabling, shuffling gait, takes Sinemet 25/100 1+1/2 tab tid and 1/2 at 3pm as needed daily            Unspecified constipation - Primary     Corrected with MiraLax daily. Also Dulcolax suppository daily prn available to her.                Review of Systems:  Review of Systems  Constitutional: Negative for fever, chills, weight loss, malaise/fatigue and diaphoresis.  HENT: Positive for hearing loss. Negative for congestion, sore throat and neck pain.   Eyes: Negative for pain, discharge and redness.  Respiratory: Negative for cough, sputum production and wheezing.   Cardiovascular: Negative for chest pain, palpitations, orthopnea, claudication, leg swelling and PND.  Gastrointestinal: Negative for heartburn, nausea, vomiting, abdominal pain, diarrhea, constipation and blood in stool.  Genitourinary: Negative for dysuria, urgency, frequency, hematuria and flank pain.  Musculoskeletal: Positive for joint  pain (left hip) and falls. Negative for myalgias and back pain.       Ambulates with walker independently.   Skin: Negative for itching and rash.       Surgical wound healed.   Neurological: Negative for dizziness, tremors, sensory change, speech change, focal weakness, seizures, loss of consciousness and weakness.       Shuffling gait  Endo/Heme/Allergies: Negative for environmental allergies and polydipsia. Does not bruise/bleed easily.  Psychiatric/Behavioral: Positive for memory loss. Negative for hallucinations. Depression: flat affect. New behviros--crying episodes.  The patient is not nervous/anxious and does not have insomnia.      Past Medical History  Diagnosis Date  . Senile dementia with depressive features 09/25/2012  . GERD (gastroesophageal reflux disease)   . Diverticulosis   . Arthritis   . Osteoporosis   . Complication of anesthesia     caused brain disease  . Paralysis agitans 09/25/2012  . Unspecified constipation 09/25/2012  . Pain in joint, pelvic region and thigh 09/25/2012.  Marland Kitchen Senile osteoporosis 09/25/2012  . Other orthopedic aftercare(V54.89) 09/25/2012   Past Surgical History  Procedure Laterality Date  . Tubal ligation    . Nipple removal      from left breast  . Abdominal hysterectomy    . Eye surgery      bilateral cataract surgery  . Hip  pinning,cannulated Left 09/14/2012    Procedure: CANNULATED HIP PINNING;  Surgeon: [name], MD;  Location: [facility];  Service: Orthopedics;  Laterality: Left;   Social History:   reports that she quit smoking about 20 years ago. She has never used smokeless tobacco. She reports that she does not drink alcohol or use illicit drugs.  Family History  Problem Relation Age of Onset  . Cancer Sister   . Cancer Mother   . Heart attack Father     Medications: Patient's Medications  New Prescriptions   No medications on file  Previous Medications   ACETAMINOPHEN (TYLENOL) 500 MG TABLET    Take 500 mg by  mouth 3 (three) times daily.   BISACODYL (DULCOLAX) 10 MG SUPPOSITORY    Place 1 suppository (10 mg total) rectally daily as needed.   CALCIUM & MAGNESIUM CARBONATES (MYLANTA PO)    Take 30 mL by mouth every 4 (four) hours as needed.   CARBIDOPA-LEVODOPA (SINEMET IR) 25-100 MG PER TABLET    Take 0.5-1.5 tablets by mouth 3 (three) times daily. Pt takes 1.5 tablets with meals. At 3p pt's caregiver gives her 1/2 tab -1 tablet depending on severity of tremors/Parkinson disease.   DONEPEZIL (ARICEPT) 10 MG TABLET    Take 10 mg by mouth at bedtime. Take 1 tablet once daily at bedtime.   FEEDING SUPPLEMENT (RESOURCE BREEZE) LIQD    Take 1 Container by mouth 2 (two) times daily.   MELOXICAM (MOBIC) 15 MG TABLET    Take 15 mg by mouth as needed for pain.   MIRTAZAPINE (REMERON) 7.5 MG TABLET    Take 1 tablet by mouth at bedtime.   NUTRITIONAL SUPPLEMENTS (BOOST PO)    Take 1 Can by mouth 2 (two) times daily.   POLYETHYLENE GLYCOL POWDER (GLYCOLAX/MIRALAX) POWDER    Take 17 g by mouth daily.   VITAMIN D, ERGOCALCIFEROL, (DRISDOL) 50000 UNITS CAPS    Take 50,000 Units by mouth every 7 (seven) days. Monday- Take 1 by mouth weekly for vitamin deficiency.  Modified Medications   No medications on file  Discontinued Medications   HYDROCODONE-ACETAMINOPHEN (NORCO/VICODIN) 5-325 MG PER TABLET    Take 1-2 tablets by mouth every 6 (six) hours as needed for pain.     Physical Exam: Physical Exam  Constitutional: She appears well-nourished.  HENT:  Head: Normocephalic and atraumatic.  Eyes: Conjunctivae and EOM are normal. Pupils are equal, round, and reactive to light.  Neck: Normal range of motion. Neck supple. No JVD present. No thyromegaly present.  Cardiovascular: Normal rate and regular rhythm.   Murmur heard. Pulmonary/Chest: No respiratory distress. She has no wheezes. She has no rales.  Abdominal: Soft. Bowel sounds are normal. She exhibits no distension and no abdominal bruit. There is no  hepatosplenomegaly, splenomegaly or hepatomegaly. There is no tenderness. There is no rigidity, no rebound, no guarding, no CVA tenderness, no tenderness at McBurney's point and negative Murphy's sign. No hernia. Hernia confirmed negative in the ventral area.  Genitourinary: Guaiac negative stool.  Musculoskeletal: Normal range of motion. She exhibits tenderness (left hip-aches with ambulation. ). She exhibits no edema.  Lymphadenopathy:    She has no cervical adenopathy.  Neurological: She is alert. She displays normal reflexes. No cranial nerve deficit. She exhibits normal muscle tone. Coordination normal.  Shuffling gait.   Skin: Skin is warm and dry.  S/p right mastectomy  Psychiatric: Her behavior is normal. Her mood appears not anxious. Her affect is not angry, not blunt,  not labile and not inappropriate. Her speech is rapid and/or pressured. Her speech is not delayed, not tangential and not slurred. She is not agitated, not aggressive, not hyperactive, not slowed, not withdrawn, not actively hallucinating and not combative. Thought content is not paranoid and not delusional. Cognition and memory are impaired. She exhibits a depressed mood. She is communicative. She exhibits abnormal recent memory. She is attentive.    Filed Vitals:   02/23/13 1534  BP: 106/74  Pulse: 68  Temp: 97.9 F (36.6 C)  TempSrc: Tympanic  Resp: 16      Labs reviewed: Basic Metabolic Panel:  Recent Labs  16/10/96 1924 09/15/12 0505 09/16/12 0423 10/11/12 10/22/12  NA 136 134* 137  --  139  K 3.9 3.2* 3.9  --  3.9  CL 105 101 106  --   --   CO2 19 20 22   --   --   GLUCOSE 89 84 93  --   --   BUN 10 10 9   --  22*  CREATININE 0.51 0.50 0.54  --  0.7  CALCIUM 8.5 8.3* 8.1*  --   --   TSH  --   --   --  1.05  --     CBC:  Recent Labs  08/19/12 1230 09/13/12 1924 09/14/12 0435 09/15/12 0505 09/16/12 0423 10/22/12  WBC 9.1 14.3* 12.0* 11.2* 9.6 6.0  NEUTROABS 6.5 12.2*  --   --   --   --    HGB 14.9 13.3 13.7 12.5 11.9* 12.5  HCT 43.1 37.4 39.7 36.8 34.1* 36  MCV 90.7 88.4 89.4 89.1 88.3  --   PLT 221 203 220 178 189 218   Past Procedures:  10/07/12 [place] abd: no definite ultrasound abnormalities of the abdomen could be identified. No gallstones in the gallbladder could be identified.   10/21/12 CXR increased interstitial findings lung fields bilaterally, which may be chronic in nature. Findings consistent with remote granulomatous disease. No evidence for congestive heart failure.  Assessment/Plan Unspecified constipation Corrected with MiraLax daily. Also Dulcolax suppository daily prn available to her.           Osteoarthritis of left hip Healed left hip fx--pain is managed with Tylenol 500mg  tid-able to ambulate with walker independently now. Meloxicam 15mg  daily prn available to her.     Parkinson disease Not disabling, shuffling gait, takes Sinemet 25/100 1+1/2 tab tid and 1/2 at 3pm as needed daily          Depression due to dementia Better managed with Mirtazapine 7.5mg  nightly.       Dementia with Lewy bodies Doing well in Memory Care Unit,  takes Aricept 10mg  daily.             Family/ Staff Communication: observe the patient  Goals of Care: SNF, [facility]  Labs/tests ordered: none

## 2013-02-23 NOTE — Assessment & Plan Note (Signed)
Better managed with Mirtazapine 7.5mg  nightly.

## 2013-02-23 NOTE — Assessment & Plan Note (Signed)
Not disabling, shuffling gait, takes Sinemet 25/100 1+1/2 tab tid and 1/2 at 3pm as needed daily.                  

## 2013-02-23 NOTE — Assessment & Plan Note (Signed)
Corrected with MiraLax daily. Also Dulcolax suppository daily prn available to her.              

## 2013-02-23 NOTE — Assessment & Plan Note (Signed)
Doing well in Memory Care Unit,  takes Aricept 10mg  daily.

## 2013-02-23 NOTE — Assessment & Plan Note (Addendum)
Healed left hip fx--pain is managed with Tylenol 500mg tid-able to ambulate with walker independently now. Meloxicam 15mg daily prn available to her.      

## 2013-03-27 ENCOUNTER — Encounter: Payer: Self-pay | Admitting: Nurse Practitioner

## 2013-03-27 ENCOUNTER — Non-Acute Institutional Stay (SKILLED_NURSING_FACILITY): Payer: Medicare Other | Admitting: Nurse Practitioner

## 2013-03-27 DIAGNOSIS — F0280 Dementia in other diseases classified elsewhere without behavioral disturbance: Secondary | ICD-10-CM

## 2013-03-27 DIAGNOSIS — G20 Parkinson's disease: Secondary | ICD-10-CM

## 2013-03-27 DIAGNOSIS — M169 Osteoarthritis of hip, unspecified: Secondary | ICD-10-CM

## 2013-03-27 DIAGNOSIS — K5900 Constipation, unspecified: Secondary | ICD-10-CM

## 2013-03-27 DIAGNOSIS — F329 Major depressive disorder, single episode, unspecified: Secondary | ICD-10-CM

## 2013-03-27 DIAGNOSIS — M1612 Unilateral primary osteoarthritis, left hip: Secondary | ICD-10-CM

## 2013-03-27 DIAGNOSIS — R5381 Other malaise: Secondary | ICD-10-CM

## 2013-03-27 NOTE — Progress Notes (Signed)
Patient ID: Deborah Hunt, female   DOB: 07-21-1926, 79 y.o.   MRN: [id number] Code Status: DNR  Allergies  Allergen Reactions  . Lidocaine   . Other Other (See Comments)    Novocaine  . Penicillins   . Sulfa Antibiotics     Chief Complaint  Patient presents with  . Medical Managment of Chronic Issues    dementia, drowsiness    HPI: Patient is a 79 y.o. female seen in the SNF at [facility] today for evaluation of her dementia, fatigue, and other chronic medical conditions.  Problem List Items Addressed This Visit   Dementia with Lewy bodies - Primary     Doing well in Memory Care Unit,  takes Aricept 10mg  daily, last MMSE 21/30 03/21/13              Depression due to dementia     Sleeps and eats well, able to maintain her weights-#85 Ibs in the past 6 months. Dc Mirtazapine due to her drowsiness.     Osteoarthritis of left hip     Healed left hip fx--pain is managed with Tylenol 500mg  tid-able to ambulate with walker independently now. Meloxicam 15mg  daily prn available to her.         Other malaise and fatigue     Chronic. Dc Mirtazapine. May workup if no better.     Parkinson disease (Chronic)     Not disabling, shuffling gait, takes Sinemet 25/100 1+1/2 tab tid and 1/2 at 3pm as needed daily              Unspecified constipation     Corrected with MiraLax daily. Also Dulcolax suppository daily prn available to her.                  Review of Systems:  Review of Systems  Constitutional: Negative for fever, chills, weight loss, malaise/fatigue and diaphoresis.  HENT: Positive for hearing loss. Negative for congestion, sore throat and neck pain.   Eyes: Negative for pain, discharge and redness.  Respiratory: Negative for cough, sputum production and wheezing.   Cardiovascular: Negative for chest pain, palpitations, orthopnea, claudication, leg swelling and PND.  Gastrointestinal: Negative for heartburn, nausea, vomiting,  abdominal pain, diarrhea, constipation and blood in stool.  Genitourinary: Negative for dysuria, urgency, frequency, hematuria and flank pain.  Musculoskeletal: Positive for joint pain (left hip) and falls. Negative for myalgias and back pain.       Ambulates with walker independently.   Skin: Negative for itching and rash.       Surgical wound healed.   Neurological: Negative for dizziness, tremors, sensory change, speech change, focal weakness, seizures, loss of consciousness and weakness.       Shuffling gait  Endo/Heme/Allergies: Negative for environmental allergies and polydipsia. Does not bruise/bleed easily.  Psychiatric/Behavioral: Positive for memory loss. Negative for hallucinations. Depression: flat affect. New behviros--crying episodes.  The patient is not nervous/anxious and does not have insomnia.      Past Medical History  Diagnosis Date  . Senile dementia with depressive features 09/25/2012  . GERD (gastroesophageal reflux disease)   . Diverticulosis   . Arthritis   . Osteoporosis   . Complication of anesthesia     caused brain disease  . Paralysis agitans 09/25/2012  . Unspecified constipation 09/25/2012  . Pain in joint, pelvic region and thigh 09/25/2012.  Marland Kitchen Senile osteoporosis 09/25/2012  . Other orthopedic aftercare(V54.89) 09/25/2012   Past Surgical History  Procedure Laterality Date  .  Tubal ligation    . Nipple removal      from left breast  . Abdominal hysterectomy    . Eye surgery      bilateral cataract surgery  . Hip pinning,cannulated Left 09/14/2012    Procedure: CANNULATED HIP PINNING;  Surgeon: [name], MD;  Location: [facility];  Service: Orthopedics;  Laterality: Left;   Social History:   reports that she quit smoking about 20 years ago. She has never used smokeless tobacco. She reports that she does not drink alcohol or use illicit drugs.  Family History  Problem Relation Age of Onset  . Cancer Sister   . Cancer Mother   . Heart  attack Father     Medications: Patient's Medications  New Prescriptions   No medications on file  Previous Medications   ACETAMINOPHEN (TYLENOL) 500 MG TABLET    Take 500 mg by mouth 3 (three) times daily.   BISACODYL (DULCOLAX) 10 MG SUPPOSITORY    Place 1 suppository (10 mg total) rectally daily as needed.   CALCIUM & MAGNESIUM CARBONATES (MYLANTA PO)    Take 30 mL by mouth every 4 (four) hours as needed.   CARBIDOPA-LEVODOPA (SINEMET IR) 25-100 MG PER TABLET    Take 0.5-1.5 tablets by mouth 3 (three) times daily. Pt takes 1.5 tablets with meals. At 3p pt's caregiver gives her 1/2 tab -1 tablet depending on severity of tremors/Parkinson disease.   DONEPEZIL (ARICEPT) 10 MG TABLET    Take 10 mg by mouth at bedtime. Take 1 tablet once daily at bedtime.   FEEDING SUPPLEMENT (RESOURCE BREEZE) LIQD    Take 1 Container by mouth 2 (two) times daily.   MELOXICAM (MOBIC) 15 MG TABLET    Take 15 mg by mouth as needed for pain.   MIRTAZAPINE (REMERON) 7.5 MG TABLET    Take 1 tablet by mouth at bedtime.   NUTRITIONAL SUPPLEMENTS (BOOST PO)    Take 1 Can by mouth 2 (two) times daily.   POLYETHYLENE GLYCOL POWDER (GLYCOLAX/MIRALAX) POWDER    Take 17 g by mouth daily.   VITAMIN D, ERGOCALCIFEROL, (DRISDOL) 50000 UNITS CAPS    Take 50,000 Units by mouth every 7 (seven) days. Monday- Take 1 by mouth weekly for vitamin deficiency.  Modified Medications   No medications on file  Discontinued Medications   No medications on file     Physical Exam: Physical Exam  Constitutional: She appears well-nourished.  HENT:  Head: Normocephalic and atraumatic.  Eyes: Conjunctivae and EOM are normal. Pupils are equal, round, and reactive to light.  Neck: Normal range of motion. Neck supple. No JVD present. No thyromegaly present.  Cardiovascular: Normal rate and regular rhythm.   Murmur heard. Pulmonary/Chest: No respiratory distress. She has no wheezes. She has no rales.  Abdominal: Soft. Bowel sounds are  normal. She exhibits no distension and no abdominal bruit. There is no hepatosplenomegaly, splenomegaly or hepatomegaly. There is no tenderness. There is no rigidity, no rebound, no guarding, no CVA tenderness, no tenderness at McBurney's point and negative Murphy's sign. No hernia. Hernia confirmed negative in the ventral area.  Genitourinary: Guaiac negative stool.  Musculoskeletal: Normal range of motion. She exhibits tenderness (left hip-aches with ambulation. ). She exhibits no edema.  Lymphadenopathy:    She has no cervical adenopathy.  Neurological: She is alert. She displays normal reflexes. No cranial nerve deficit. She exhibits normal muscle tone. Coordination normal.  Shuffling gait.   Skin: Skin is warm and dry.  S/p right  mastectomy  Psychiatric: Her behavior is normal. Her mood appears not anxious. Her affect is not angry, not blunt, not labile and not inappropriate. Her speech is rapid and/or pressured. Her speech is not delayed, not tangential and not slurred. She is not agitated, not aggressive, not hyperactive, not slowed, not withdrawn, not actively hallucinating and not combative. Thought content is not paranoid and not delusional. Cognition and memory are impaired. She exhibits a depressed mood. She is communicative. She exhibits abnormal recent memory. She is attentive.    Filed Vitals:   03/27/13 1528  BP: 120/60  Pulse: 72  Temp: 97 F (36.1 C)  TempSrc: Tympanic  Resp: 18   Labs reviewed: Basic Metabolic Panel:  Recent Labs  16/10/96 1924 09/15/12 0505 09/16/12 0423 10/11/12 10/22/12  NA 136 134* 137  --  139  K 3.9 3.2* 3.9  --  3.9  CL 105 101 106  --   --   CO2 19 20 22   --   --   GLUCOSE 89 84 93  --   --   BUN 10 10 9   --  22*  CREATININE 0.51 0.50 0.54  --  0.7  CALCIUM 8.5 8.3* 8.1*  --   --   TSH  --   --   --  1.05  --     CBC:  Recent Labs  08/19/12 1230 09/13/12 1924 09/14/12 0435 09/15/12 0505 09/16/12 0423 10/22/12  WBC 9.1 14.3*  12.0* 11.2* 9.6 6.0  NEUTROABS 6.5 12.2*  --   --   --   --   HGB 14.9 13.3 13.7 12.5 11.9* 12.5  HCT 43.1 37.4 39.7 36.8 34.1* 36  MCV 90.7 88.4 89.4 89.1 88.3  --   PLT 221 203 220 178 189 218   Past Procedures:  10/07/12 [place] abd: no definite ultrasound abnormalities of the abdomen could be identified. No gallstones in the gallbladder could be identified.   10/21/12 CXR increased interstitial findings lung fields bilaterally, which may be chronic in nature. Findings consistent with remote granulomatous disease. No evidence for congestive heart failure.  Assessment/Plan Dementia with Lewy bodies Doing well in Memory Care Unit,  takes Aricept 10mg  daily, last MMSE 21/30 03/21/13            Depression due to dementia Sleeps and eats well, able to maintain her weights-#85 Ibs in the past 6 months. Dc Mirtazapine due to her drowsiness.   Unspecified constipation Corrected with MiraLax daily. Also Dulcolax suppository daily prn available to her.             Parkinson disease Not disabling, shuffling gait, takes Sinemet 25/100 1+1/2 tab tid and 1/2 at 3pm as needed daily            Osteoarthritis of left hip Healed left hip fx--pain is managed with Tylenol 500mg  tid-able to ambulate with walker independently now. Meloxicam 15mg  daily prn available to her.       Other malaise and fatigue Chronic. Dc Mirtazapine. May workup if no better.     Family/ Staff Communication: observe the patient  Goals of Care: SNF, [facility]  Labs/tests ordered: none

## 2013-03-27 NOTE — Assessment & Plan Note (Signed)
Corrected with MiraLax daily. Also Dulcolax suppository daily prn available to her.

## 2013-03-27 NOTE — Assessment & Plan Note (Signed)
Doing well in Memory Care Unit,  takes Aricept 10mg  daily, last MMSE 21/30 03/21/13

## 2013-03-27 NOTE — Assessment & Plan Note (Signed)
Not disabling, shuffling gait, takes Sinemet 25/100 1+1/2 tab tid and 1/2 at 3pm as needed daily.                  

## 2013-03-27 NOTE — Assessment & Plan Note (Signed)
Healed left hip fx--pain is managed with Tylenol 500mg  tid-able to ambulate with walker independently now. Meloxicam 15mg  daily prn available to her.

## 2013-03-27 NOTE — Assessment & Plan Note (Addendum)
Chronic. Dc Mirtazapine. May workup if no better.

## 2013-03-27 NOTE — Assessment & Plan Note (Signed)
Sleeps and eats well, able to maintain her weights-#85 Ibs in the past 6 months. Dc Mirtazapine due to her drowsiness.

## 2013-04-24 ENCOUNTER — Encounter: Payer: Self-pay | Admitting: Nurse Practitioner

## 2013-04-24 ENCOUNTER — Non-Acute Institutional Stay (SKILLED_NURSING_FACILITY): Payer: Medicare Other | Admitting: Nurse Practitioner

## 2013-04-24 DIAGNOSIS — F329 Major depressive disorder, single episode, unspecified: Secondary | ICD-10-CM

## 2013-04-24 DIAGNOSIS — F0280 Dementia in other diseases classified elsewhere without behavioral disturbance: Secondary | ICD-10-CM

## 2013-04-24 DIAGNOSIS — G20 Parkinson's disease: Secondary | ICD-10-CM

## 2013-04-24 DIAGNOSIS — K5900 Constipation, unspecified: Secondary | ICD-10-CM

## 2013-04-24 NOTE — Assessment & Plan Note (Signed)
Doing well in Memory Care Unit,  takes Aricept 10mg  daily, last MMSE 21/30 03/21/13

## 2013-04-24 NOTE — Assessment & Plan Note (Addendum)
Not disabling, shuffling gait, takes Sinemet 25/100 1+1/2 tab tid and 1/2 at 3pm as needed daily. Update CBC, CMP, TSH

## 2013-04-24 NOTE — Assessment & Plan Note (Signed)
Sleeps and eats well, able to maintain her weights-#85 Ibs in the past 6 months. Dc Mirtazapine due to her drowsiness.

## 2013-04-24 NOTE — Progress Notes (Signed)
Patient ID: Deborah Hunt, female   DOB: 1927-04-26, 79 y.o.   MRN: [id number]  Code Status: DNR  Allergies  Allergen Reactions  . Lidocaine   . Other Other (See Comments)    Novocaine  . Penicillins   . Sulfa Antibiotics     Chief Complaint  Patient presents with  . Medical Managment of Chronic Issues    HPI: Patient is a 79 y.o. female seen in the SNF at [facility] today for evaluation of her chronic medical conditions.  Problem List Items Addressed This Visit   Dementia with Lewy bodies     Doing well in Memory Care Unit,  takes Aricept 10mg  daily, last MMSE 21/30 03/21/13                Depression due to dementia     Sleeps and eats well, able to maintain her weights-#85 Ibs in the past 6 months. Dc Mirtazapine due to her drowsiness.       Parkinson disease (Chronic)     Not disabling, shuffling gait, takes Sinemet 25/100 1+1/2 tab tid and 1/2 at 3pm as needed daily. Update CBC, CMP, TSH                Unspecified constipation - Primary     Corrected with MiraLax daily. Also Dulcolax suppository daily prn available to her.                    Review of Systems:  Review of Systems  Constitutional: Negative for fever, chills, weight loss, malaise/fatigue and diaphoresis.  HENT: Positive for hearing loss. Negative for congestion and sore throat.   Eyes: Negative for pain, discharge and redness.  Respiratory: Negative for cough, sputum production and wheezing.   Cardiovascular: Negative for chest pain, palpitations, orthopnea, claudication, leg swelling and PND.  Gastrointestinal: Negative for heartburn, nausea, vomiting, abdominal pain, diarrhea, constipation and blood in stool.  Genitourinary: Negative for dysuria, urgency, frequency, hematuria and flank pain.  Musculoskeletal: Positive for falls and joint pain (left hip). Negative for back pain, myalgias and neck pain.       Ambulates with walker independently.   Skin:  Negative for itching and rash.       Surgical wound healed.   Neurological: Negative for dizziness, tremors, sensory change, speech change, focal weakness, seizures, loss of consciousness and weakness.       Shuffling gait  Endo/Heme/Allergies: Negative for environmental allergies and polydipsia. Does not bruise/bleed easily.  Psychiatric/Behavioral: Positive for memory loss. Negative for hallucinations. Depression: flat affect. New behviros--crying episodes.  The patient is not nervous/anxious and does not have insomnia.      Past Medical History  Diagnosis Date  . Senile dementia with depressive features 09/25/2012  . GERD (gastroesophageal reflux disease)   . Diverticulosis   . Arthritis   . Osteoporosis   . Complication of anesthesia     caused brain disease  . Paralysis agitans 09/25/2012  . Unspecified constipation 09/25/2012  . Pain in joint, pelvic region and thigh 09/25/2012.  Marland Kitchen Senile osteoporosis 09/25/2012  . Other orthopedic aftercare(V54.89) 09/25/2012   Past Surgical History  Procedure Laterality Date  . Tubal ligation    . Nipple removal      from left breast  . Abdominal hysterectomy    . Eye surgery      bilateral cataract surgery  . Hip pinning,cannulated Left 09/14/2012    Procedure: CANNULATED HIP PINNING;  Surgeon: [name], MD;  Location:  [facility];  Service: Orthopedics;  Laterality: Left;   Social History:   reports that she quit smoking about 20 years ago. She has never used smokeless tobacco. She reports that she does not drink alcohol or use illicit drugs.  Family History  Problem Relation Age of Onset  . Cancer Sister   . Cancer Mother   . Heart attack Father     Medications: Patient's Medications  New Prescriptions   No medications on file  Previous Medications   ACETAMINOPHEN (TYLENOL) 500 MG TABLET    Take 500 mg by mouth 3 (three) times daily.   BISACODYL (DULCOLAX) 10 MG SUPPOSITORY    Place 1 suppository (10 mg total) rectally  daily as needed.   CALCIUM & MAGNESIUM CARBONATES (MYLANTA PO)    Take 30 mL by mouth every 4 (four) hours as needed.   CARBIDOPA-LEVODOPA (SINEMET IR) 25-100 MG PER TABLET    Take 0.5-1.5 tablets by mouth 3 (three) times daily. Pt takes 1.5 tablets with meals. At 3p pt's caregiver gives her 1/2 tab -1 tablet depending on severity of tremors/Parkinson disease.   DONEPEZIL (ARICEPT) 10 MG TABLET    Take 10 mg by mouth at bedtime. Take 1 tablet once daily at bedtime.   FEEDING SUPPLEMENT (RESOURCE BREEZE) LIQD    Take 1 Container by mouth 2 (two) times daily.   MELOXICAM (MOBIC) 15 MG TABLET    Take 15 mg by mouth as needed for pain.   MIRTAZAPINE (REMERON) 7.5 MG TABLET    Take 1 tablet by mouth at bedtime.   NUTRITIONAL SUPPLEMENTS (BOOST PO)    Take 1 Can by mouth 2 (two) times daily.   POLYETHYLENE GLYCOL POWDER (GLYCOLAX/MIRALAX) POWDER    Take 17 g by mouth daily.   VITAMIN D, ERGOCALCIFEROL, (DRISDOL) 50000 UNITS CAPS    Take 50,000 Units by mouth every 7 (seven) days. Monday- Take 1 by mouth weekly for vitamin deficiency.  Modified Medications   No medications on file  Discontinued Medications   No medications on file     Physical Exam: Physical Exam  Constitutional: She appears well-nourished.  HENT:  Head: Normocephalic and atraumatic.  Eyes: Conjunctivae and EOM are normal. Pupils are equal, round, and reactive to light.  Neck: Normal range of motion. Neck supple. No JVD present. No thyromegaly present.  Cardiovascular: Normal rate and regular rhythm.   Murmur heard. Pulmonary/Chest: No respiratory distress. She has no wheezes. She has no rales.  Abdominal: Soft. Bowel sounds are normal. She exhibits no distension and no abdominal bruit. There is no hepatosplenomegaly, splenomegaly or hepatomegaly. There is no tenderness. There is no rigidity, no rebound, no guarding, no CVA tenderness, no tenderness at McBurney's point and negative Murphy's sign. No hernia. Hernia confirmed  negative in the ventral area.  Genitourinary: Guaiac negative stool.  Musculoskeletal: Normal range of motion. She exhibits tenderness (left hip-aches with ambulation. ). She exhibits no edema.  Lymphadenopathy:    She has no cervical adenopathy.  Neurological: She is alert. She displays normal reflexes. No cranial nerve deficit. She exhibits normal muscle tone. Coordination normal.  Shuffling gait.   Skin: Skin is warm and dry.  S/p right mastectomy  Psychiatric: Her behavior is normal. Her mood appears not anxious. Her affect is not angry, not blunt, not labile and not inappropriate. Her speech is rapid and/or pressured. Her speech is not delayed, not tangential and not slurred. She is not agitated, not aggressive, not hyperactive, not slowed, not withdrawn, not actively hallucinating  and not combative. Thought content is not paranoid and not delusional. Cognition and memory are impaired. She exhibits a depressed mood. She is communicative. She exhibits abnormal recent memory. She is attentive.    Filed Vitals:   04/24/13 1618  BP: 102/74  Pulse: 82  Temp: 98 F (36.7 C)  TempSrc: Tympanic  Resp: 20   Labs reviewed: Basic Metabolic Panel:  Recent Labs  16/10/96 1924 09/15/12 0505 09/16/12 0423 10/11/12 10/22/12  NA 136 134* 137  --  139  K 3.9 3.2* 3.9  --  3.9  CL 105 101 106  --   --   CO2 19 20 22   --   --   GLUCOSE 89 84 93  --   --   BUN 10 10 9   --  22*  CREATININE 0.51 0.50 0.54  --  0.7  CALCIUM 8.5 8.3* 8.1*  --   --   TSH  --   --   --  1.05  --     CBC:  Recent Labs  08/19/12 1230 09/13/12 1924 09/14/12 0435 09/15/12 0505 09/16/12 0423 10/22/12  WBC 9.1 14.3* 12.0* 11.2* 9.6 6.0  NEUTROABS 6.5 12.2*  --   --   --   --   HGB 14.9 13.3 13.7 12.5 11.9* 12.5  HCT 43.1 37.4 39.7 36.8 34.1* 36  MCV 90.7 88.4 89.4 89.1 88.3  --   PLT 221 203 220 178 189 218   Past Procedures:  10/07/12 [place] abd: no definite ultrasound abnormalities of the abdomen could be  identified. No gallstones in the gallbladder could be identified.   10/21/12 CXR increased interstitial findings lung fields bilaterally, which may be chronic in nature. Findings consistent with remote granulomatous disease. No evidence for congestive heart failure.  Assessment/Plan Unspecified constipation Corrected with MiraLax daily. Also Dulcolax suppository daily prn available to her.               Parkinson disease Not disabling, shuffling gait, takes Sinemet 25/100 1+1/2 tab tid and 1/2 at 3pm as needed daily. Update CBC, CMP, TSH              Depression due to dementia Sleeps and eats well, able to maintain her weights-#85 Ibs in the past 6 months. Dc Mirtazapine due to her drowsiness.     Dementia with Lewy bodies Doing well in Memory Care Unit,  takes Aricept 10mg  daily, last MMSE 21/30 03/21/13                Family/ Staff Communication: observe the patient  Goals of Care: SNF, [facility]  Labs/tests ordered: CBC and CMP

## 2013-04-24 NOTE — Assessment & Plan Note (Signed)
Corrected with MiraLax daily. Also Dulcolax suppository daily prn available to her.

## 2013-04-30 ENCOUNTER — Inpatient Hospital Stay (HOSPITAL_COMMUNITY)
Admission: EM | Admit: 2013-04-30 | Discharge: 2013-05-03 | DRG: 481 | Disposition: A | Discharge status: Skilled Nursing Facility | Payer: Medicare Other | Attending: Family Medicine | Admitting: Family Medicine

## 2013-04-30 ENCOUNTER — Emergency Department (HOSPITAL_COMMUNITY): Payer: Medicare Other

## 2013-04-30 DIAGNOSIS — M810 Age-related osteoporosis without current pathological fracture: Secondary | ICD-10-CM | POA: Diagnosis present

## 2013-04-30 DIAGNOSIS — Z66 Do not resuscitate: Secondary | ICD-10-CM | POA: Diagnosis present

## 2013-04-30 DIAGNOSIS — S72009A Fracture of unspecified part of neck of unspecified femur, initial encounter for closed fracture: Secondary | ICD-10-CM

## 2013-04-30 DIAGNOSIS — F0393 Unspecified dementia, unspecified severity, with mood disturbance: Secondary | ICD-10-CM | POA: Diagnosis present

## 2013-04-30 DIAGNOSIS — G20A1 Parkinson's disease without dyskinesia, without mention of fluctuations: Secondary | ICD-10-CM | POA: Diagnosis present

## 2013-04-30 DIAGNOSIS — S72001A Fracture of unspecified part of neck of right femur, initial encounter for closed fracture: Secondary | ICD-10-CM

## 2013-04-30 DIAGNOSIS — F0280 Dementia in other diseases classified elsewhere without behavioral disturbance: Secondary | ICD-10-CM | POA: Diagnosis present

## 2013-04-30 DIAGNOSIS — G20 Parkinson's disease: Secondary | ICD-10-CM | POA: Diagnosis present

## 2013-04-30 DIAGNOSIS — S72141A Displaced intertrochanteric fracture of right femur, initial encounter for closed fracture: Secondary | ICD-10-CM

## 2013-04-30 HISTORY — DX: Displaced intertrochanteric fracture of right femur, initial encounter for closed fracture: S72.141A

## 2013-04-30 LAB — CBC WITH DIFFERENTIAL/PLATELET
Basophils Absolute: 0 10*3/uL (ref 0.0–0.1)
Eosinophils Absolute: 0.1 10*3/uL (ref 0.0–0.7)
Eosinophils Relative: 1 % (ref 0–5)
Hemoglobin: 14.4 g/dL (ref 12.0–15.0)
Lymphocytes Relative: 15 % (ref 12–46)
Lymphs Abs: 1.6 10*3/uL (ref 0.7–4.0)
MCH: 32.4 pg (ref 26.0–34.0)
MCV: 95.3 fL (ref 78.0–100.0)
Monocytes Absolute: 0.4 10*3/uL (ref 0.1–1.0)
Monocytes Relative: 4 % (ref 3–12)
Platelets: 235 10*3/uL (ref 150–400)
RDW: 12.3 % (ref 11.5–15.5)
WBC: 10.6 10*3/uL — ABNORMAL HIGH (ref 4.0–10.5)

## 2013-04-30 LAB — BASIC METABOLIC PANEL
BUN: 11 mg/dL (ref 6–23)
CO2: 27 mEq/L (ref 19–32)
Calcium: 9.8 mg/dL (ref 8.4–10.5)
Creatinine, Ser: 0.6 mg/dL (ref 0.50–1.10)
Glucose, Bld: 88 mg/dL (ref 70–99)

## 2013-04-30 LAB — URINE MICROSCOPIC-ADD ON

## 2013-04-30 LAB — URINALYSIS, ROUTINE W REFLEX MICROSCOPIC
Bilirubin Urine: NEGATIVE
Glucose, UA: NEGATIVE mg/dL
Nitrite: POSITIVE — AB
Specific Gravity, Urine: 1.016 (ref 1.005–1.030)

## 2013-04-30 LAB — TYPE AND SCREEN: ABO/RH(D): B NEG

## 2013-04-30 LAB — ABO/RH: ABO/RH(D): B NEG

## 2013-04-30 LAB — PROTIME-INR: Prothrombin Time: 12.2 seconds (ref 11.6–15.2)

## 2013-04-30 MED ORDER — SODIUM CHLORIDE 0.9 % IV SOLN
INTRAVENOUS | Status: DC
  Administered 2013-04-30: 1000 mL via INTRAVENOUS

## 2013-04-30 MED ORDER — HYDROMORPHONE HCL PF 1 MG/ML IJ SOLN
0.1000 mg | INTRAMUSCULAR | Status: DC | PRN
  Administered 2013-04-30 – 2013-05-01 (×3): 0.2 mg via INTRAVENOUS
  Administered 2013-05-01: 0.1 mg via INTRAVENOUS
  Filled 2013-04-30 (×4): qty 1

## 2013-04-30 MED ORDER — SODIUM CHLORIDE 0.9 % IV SOLN
1000.0000 mL | Freq: Once | INTRAVENOUS | Status: AC
  Administered 2013-04-30: 1000 mL via INTRAVENOUS

## 2013-04-30 MED ORDER — LIP MEDEX EX OINT
TOPICAL_OINTMENT | CUTANEOUS | Status: DC | PRN
  Administered 2013-04-30: 21:00:00 via TOPICAL
  Filled 2013-04-30: qty 7

## 2013-04-30 MED ORDER — CARBIDOPA-LEVODOPA 25-100 MG PO TABS
1.0000 | ORAL_TABLET | Freq: Three times a day (TID) | ORAL | Status: DC
  Filled 2013-04-30 (×2): qty 1

## 2013-04-30 MED ORDER — ACETAMINOPHEN 500 MG PO TABS: 500.0000 mg | ORAL_TABLET | Freq: Three times a day (TID) | ORAL | Status: DC | PRN

## 2013-04-30 MED ORDER — DONEPEZIL HCL 10 MG PO TABS
10.0000 mg | ORAL_TABLET | Freq: Every day | ORAL | Status: DC
  Administered 2013-04-30 – 2013-05-02 (×2): 10 mg via ORAL
  Filled 2013-04-30 (×4): qty 1

## 2013-04-30 MED ORDER — FENTANYL CITRATE 0.05 MG/ML IJ SOLN: 25.0000 ug | INTRAMUSCULAR | Status: DC | PRN

## 2013-04-30 MED ORDER — SODIUM CHLORIDE 0.9 % IV SOLN
1000.0000 mL | INTRAVENOUS | Status: DC
  Administered 2013-04-30: 1000 mL via INTRAVENOUS

## 2013-04-30 MED ORDER — CARBIDOPA-LEVODOPA 25-100 MG PO TABS: 1.0000 | ORAL_TABLET | ORAL | Status: DC

## 2013-04-30 MED ORDER — CARBIDOPA-LEVODOPA 25-100 MG PO TABS
1.0000 | ORAL_TABLET | ORAL | Status: DC
  Administered 2013-05-02: 16:00:00 1 via ORAL
  Filled 2013-04-30 (×3): qty 1

## 2013-04-30 MED ORDER — ONDANSETRON HCL 4 MG/2ML IJ SOLN
4.0000 mg | Freq: Three times a day (TID) | INTRAMUSCULAR | Status: AC | PRN
  Filled 2013-04-30: qty 2

## 2013-04-30 MED ORDER — HYDROCODONE-ACETAMINOPHEN 5-325 MG PO TABS
1.0000 | ORAL_TABLET | ORAL | Status: AC | PRN
  Administered 2013-04-30: 21:00:00 2 via ORAL
  Filled 2013-04-30 (×2): qty 2

## 2013-04-30 MED ORDER — CARBIDOPA-LEVODOPA 25-100 MG PO TABS
1.5000 | ORAL_TABLET | Freq: Three times a day (TID) | ORAL | Status: DC
  Administered 2013-04-30 – 2013-05-03 (×8): 1.5 via ORAL
  Filled 2013-04-30 (×11): qty 1.5

## 2013-04-30 MED ORDER — SODIUM CHLORIDE 0.9 % IV SOLN
INTRAVENOUS | Status: DC
  Administered 2013-05-01: 04:00:00 via INTRAVENOUS

## 2013-04-30 MED ORDER — CARBIDOPA-LEVODOPA 25-100 MG PO TABS
0.5000 | ORAL_TABLET | Freq: Three times a day (TID) | ORAL | Status: DC
  Filled 2013-04-30 (×2): qty 1.5

## 2013-04-30 MED ORDER — FENTANYL CITRATE 0.05 MG/ML IJ SOLN
50.0000 ug | INTRAMUSCULAR | Status: DC | PRN
  Administered 2013-04-30: 50 ug via INTRAVENOUS
  Filled 2013-04-30: qty 2

## 2013-04-30 NOTE — Progress Notes (Signed)
Patient ID: Deborah Hunt, female   DOB: June 01, 1927, 79 y.o.   MRN: [id number] Deborah Hunt consulted for right Hip fracture. Pt with dementia suffered from a fall today. Plan ORIF tomorrow by Dr. Charlann Boxer. Orders completed. Formal consult to follow. Admitted by Hospitalist.

## 2013-04-30 NOTE — ED Provider Notes (Signed)
CSN: [id number]     Arrival date & time 04/30/13  1030 History   First MD Initiated Contact with Patient 04/30/13 1034     Chief Complaint  Patient presents with  . Fall   HPI  Patient presents after a fall.  Patient is ambulatory with walker at baseline.  Per report the patient fall at home, since that time has had pain in the right hip.  Patient has not been any liver since the fall.  Patient arrives via EMS, with cervical collar in place.  She discussed pain in her hips, right leg, denies head pain, neck pain. Eventually the patient's husband arrives.  He states patient is interacting at baseline. Patient's dementia limits the history of present illness.  This is a level V caveat.  Past Medical History  Diagnosis Date  . Senile dementia with depressive features 09/25/2012  . GERD (gastroesophageal reflux disease)   . Diverticulosis   . Arthritis   . Osteoporosis   . Complication of anesthesia     caused brain disease  . Paralysis agitans 09/25/2012  . Unspecified constipation 09/25/2012  . Pain in joint, pelvic region and thigh 09/25/2012.  Marland Kitchen Senile osteoporosis 09/25/2012  . Other orthopedic aftercare(V54.89) 09/25/2012   Past Surgical History  Procedure Laterality Date  . Tubal ligation    . Nipple removal      from left breast  . Abdominal hysterectomy    . Eye surgery      bilateral cataract surgery  . Hip pinning,cannulated Left 09/14/2012    Procedure: CANNULATED HIP PINNING;  Surgeon: [name], MD;  Location: [facility];  Service: Orthopedics;  Laterality: Left;   Family History  Problem Relation Age of Onset  . Cancer Sister   . Cancer Mother   . Heart attack Father    History  Substance Use Topics  . Smoking status: Former Smoker -- 0.50 packs/day for 30 years    Quit date: 09/13/1992  . Smokeless tobacco: Never Used  . Alcohol Use: No     Comment: occasionally   OB History   Grav Para Term Preterm Abortions TAB SAB Ect Mult Living                  Review of Systems  Unable to perform ROS: Dementia    Allergies  Lidocaine; Other; Penicillins; and Sulfa antibiotics  Home Medications   Current Outpatient Rx  Name  Route  Sig  Dispense  Refill  . acetaminophen (TYLENOL) 500 MG tablet   Oral   Take 500 mg by mouth 3 (three) times daily.         . bisacodyl (DULCOLAX) 10 MG suppository   Rectal   Place 1 suppository (10 mg total) rectally daily as needed.   12 suppository   0   . carbidopa-levodopa (SINEMET IR) 25-100 MG per tablet   Oral   Take 0.5-1.5 tablets by mouth 3 (three) times daily. Pt takes 1.5 tablets with meals. At 3p pt's caregiver gives her 1/2 tab -1 tablet depending on severity of tremors/Parkinson disease.         . donepezil (ARICEPT) 10 MG tablet   Oral   Take 10 mg by mouth at bedtime. Take 1 tablet once daily at bedtime.         . feeding supplement (RESOURCE BREEZE) LIQD   Oral   Take 1 Container by mouth 2 (two) times daily.         Marland Kitchen loperamide (IMODIUM A-D)  2 MG tablet   Oral   Take 4 mg by mouth 4 (four) times daily as needed for diarrhea or loose stools (loose stools).         . meloxicam (MOBIC) 15 MG tablet   Oral   Take 15 mg by mouth as needed for pain.         . Nutritional Supplements (BOOST PO)   Oral   Take 1 Can by mouth 2 (two) times daily.         . polyethylene glycol powder (GLYCOLAX/MIRALAX) powder   Oral   Take 17 g by mouth as needed (constipation).          . Vitamin D, Ergocalciferol, (DRISDOL) 50000 UNITS CAPS   Oral   Take 50,000 Units by mouth every 7 (seven) days. Monday- Take 1 by mouth weekly for vitamin deficiency.          BP 164/54  Pulse 60  Resp 18  SpO2 93% Physical Exam  Nursing note and vitals reviewed. Constitutional: She has a sickly appearance.  Eyes: Conjunctivae are normal. Right eye exhibits no discharge. Left eye exhibits no discharge.  Neck: Neck supple. No spinous process tenderness and no muscular tenderness  present. No rigidity. No tracheal deviation and no edema present.  Cardiovascular: Normal rate and regular rhythm.   Pulmonary/Chest: Breath sounds normal. No stridor.  Abdominal: Soft. Normal appearance. There is no tenderness.  Musculoskeletal:  Patient has tenderness to palpation of both hips, but greater on the right.  She is unwilling to flex or extend the right hip.  She is non-participatory with further exam of the right knee, left hip. She does move both ankles, all toes appropriately.   Neurological:  Non-participatory in full neurologic exam.  However, there is no facial asymmetry, speech is clear, and the patient moves all distal extremities spontaneously.  Psychiatric: Her mood appears anxious. Her speech is delayed. She is withdrawn. Cognition and memory are impaired.    ED Course  Procedures (including critical care time) Labs Review Labs Reviewed  BASIC METABOLIC PANEL  CBC WITH DIFFERENTIAL  PROTIME-INR  TYPE AND SCREEN   Imaging Review No results found.  EKG Interpretation     Ventricular Rate:  81 PR Interval:  128 QRS Duration: 79 QT Interval:  421 QTC Calculation: 489 R Axis:   46 Text Interpretation:  Sinus arrhythmia Nonspecific repol abnormality, diffuse leads Borderline prolonged QT interval Abnormal ekg           after the initial evaluation I discussed the patient's case with our orthopedist, Dr.Collins, she will assist in surgical repair of the patient's intratrochanteric fracture on the right hip.  Update: I demonstrated the x-ray findings to the patient's husband.  There is gross abnormality in the right hip.   MDM  No diagnosis found. This elderly female presents following a fall and is found to have right intratrochanteric fracture.  The patient is admitted in stable.  Patient was admitted to the hospitalist service with our orthopedists on consult for definitive care.    Gerhard Munch, MD 04/30/13 [phone]

## 2013-04-30 NOTE — ED Notes (Signed)
Patient transported to X-ray 

## 2013-04-30 NOTE — ED Notes (Addendum)
Per EMS patient with Alzheimer's dementia and Hx of hip fracture in February experienced an unwitnessed fall this morning whilst brushing teeth and c/o pain in right hip and tailbone, pt unable to straighten legs.. EMS administered 50 mcg of Fentanyl in order to move patient onto spine board.

## 2013-04-30 NOTE — H&P (Signed)
[facility] History and Physical  Deborah Hunt:[id number] DOB: 1927/01/06 DOA: 04/30/2013  Referring physician: Dr. Jeraldine Loots PCP: Thora Lance, MD  Specialists: Orthopedic surgery  Chief Complaint: right hip pain  HPI: Deborah Hunt is a 79 y.o. female has a past medical history significant for dementia, recent left hip fx in march 2013 presenting to the ED with right hip pain after a fall today. At baseline, she is ambulatory with a walker. Patient has advanced dementia and cannot contribute to the story, but per husband and chart review she had a mechanical fall this morning while brushing her teeth. There is no reported syncope and prior to this event she was at baseline.   Review of Systems: unable to obtain ROS due to baseline dementia  Past Medical History  Diagnosis Date  . Senile dementia with depressive features 09/25/2012  . GERD (gastroesophageal reflux disease)   . Diverticulosis   . Arthritis   . Osteoporosis   . Complication of anesthesia     caused brain disease  . Paralysis agitans 09/25/2012  . Unspecified constipation 09/25/2012  . Pain in joint, pelvic region and thigh 09/25/2012.  Marland Kitchen Senile osteoporosis 09/25/2012  . Other orthopedic aftercare(V54.89) 09/25/2012   Past Surgical History  Procedure Laterality Date  . Tubal ligation    . Nipple removal      from left breast  . Abdominal hysterectomy    . Eye surgery      bilateral cataract surgery  . Hip pinning,cannulated Left 09/14/2012    Procedure: CANNULATED HIP PINNING;  Surgeon: [name], MD;  Location: [facility];  Service: Orthopedics;  Laterality: Left;   Social History:  reports that she quit smoking about 20 years ago. She has never used smokeless tobacco. She reports that she does not drink alcohol or use illicit drugs.  Allergies  Allergen Reactions  . Lidocaine   . Other Other (See Comments)    Novocaine  . Penicillins   . Sulfa Antibiotics     Family History   Problem Relation Age of Onset  . Cancer Sister   . Cancer Mother   . Heart attack Father    Prior to Admission medications   Medication Sig Start Date End Date Taking? Authorizing Provider  acetaminophen (TYLENOL) 500 MG tablet Take 500 mg by mouth 3 (three) times daily.   Yes Historical Provider, MD  bisacodyl (DULCOLAX) 10 MG suppository Place 1 suppository (10 mg total) rectally daily as needed. 09/16/12  Yes Nishant Dhungel, MD  carbidopa-levodopa (SINEMET IR) 25-100 MG per tablet Take 0.5-1.5 tablets by mouth 3 (three) times daily. Pt takes 1.5 tablets with meals. At 3p pt's caregiver gives her 1/2 tab -1 tablet depending on severity of tremors/Parkinson disease.   Yes Historical Provider, MD  donepezil (ARICEPT) 10 MG tablet Take 10 mg by mouth at bedtime. Take 1 tablet once daily at bedtime.   Yes Historical Provider, MD  feeding supplement (RESOURCE BREEZE) LIQD Take 1 Container by mouth 2 (two) times daily.   Yes Historical Provider, MD  loperamide (IMODIUM A-D) 2 MG tablet Take 4 mg by mouth 4 (four) times daily as needed for diarrhea or loose stools (loose stools).   Yes Historical Provider, MD  meloxicam (MOBIC) 15 MG tablet Take 15 mg by mouth as needed for pain.   Yes Historical Provider, MD  Nutritional Supplements (BOOST PO) Take 1 Can by mouth 2 (two) times daily.   Yes Historical Provider, MD  polyethylene glycol powder (GLYCOLAX/MIRALAX)  powder Take 17 g by mouth as needed (constipation).  02/02/13  Yes Historical Provider, MD  Vitamin D, Ergocalciferol, (DRISDOL) 50000 UNITS CAPS Take 50,000 Units by mouth every 7 (seven) days. Monday- Take 1 by mouth weekly for vitamin deficiency.   Yes Historical Provider, MD   Physical Exam: Filed Vitals:   04/30/13 1055 04/30/13 1136  BP: 164/54   Pulse: 60   Temp:  97.4 F (36.3 C)  TempSrc:  Tympanic  Resp: 18   SpO2: 93%      General:  No apparent distress, staring at the ceiling, poor eye contact  Eyes: PERRL, EOMI, no  scleral icterus  ENT: moist oropharynx  Neck: supple, no JVD  Cardiovascular: regular rate without MRG; 2+ peripheral pulses  Respiratory: CTA biL, good air movement without wheezing, rhonchi or crackled  Abdomen: soft, non tender to palpation, positive bowel sounds, no guarding, no rebound  Skin: no rashes  Musculoskeletal: no peripheral edema  Psychiatric: very flat effect  Neurologic: non focal, AxOx1 to person only  Labs on Admission:  Basic Metabolic Panel:  Recent Labs Lab 04/30/13 1200  NA 143  K 3.5  CL 104  CO2 27  GLUCOSE 88  BUN 11  CREATININE 0.60  CALCIUM 9.8   CBC:  Recent Labs Lab 04/30/13 1200  WBC 10.6*  NEUTROABS 8.5*  HGB 14.4  HCT 42.3  MCV 95.3  PLT 235   Radiological Exams on Admission: Dg Chest 1 View  04/30/2013   CLINICAL DATA:  Larey Seat today, hip pain  EXAM: CHEST - 1 VIEW  COMPARISON:  09/13/2012  FINDINGS: Heart size within normal limits. No evidence of consolidation or effusion. Bony thorax is intact.  IMPRESSION: No active disease.   Electronically Signed   By: Esperanza Heir M.D.   On: 04/30/2013 11:37   Dg Hip Bilateral W/pelvis  04/30/2013   CLINICAL DATA:  Fall with bilateral hip pain. History prior left hip fracture with pinning.  EXAM: BILATERAL HIP WITH PELVIS - 4+ VIEW  COMPARISON:  09/14/2012  FINDINGS: Acute intertrochanteric fracture of the right hip demonstrates displacement. No evidence of dislocation. Screws across the left femoral neck shows stable positioning and there is no evidence of acute left hip fracture. The left femoral neck is very foreshortened on the radiographs which is felt to primarily relate to leg rotation. If there is any further remaining suspicion of potential acute injury to left hip, repeat dedicated films would be recommended in a better position. The bony pelvis is intact.  IMPRESSION: 1. Acute intertrochanteric fracture of the right hip. 2. No acute fracture is identified on the left with  previously placed screws demonstrating stable positioning. The left femoral neck is considerably foreshortened on the images due to rotation. If there remains any significant left hip pain, recommend repeat hip radiographs.   Electronically Signed   By: Irish Lack M.D.   On: 04/30/2013 11:50    EKG: Independently reviewed. Pending  Assessment/Plan Principal Problem:   Hip fracture, right Active Problems:   Parkinson disease   Osteoporosis, unspecified   Depression due to dementia   DNR (do not resuscitate)   Right hip fracture - s/p mechanical fall. EKG pending. - orthopedic surgery consulted by EDP. - PT consult post op - nutrition consult post op Dementia - continue home medications Parkinson disease - continue home medications Leukocytosis - minimal, likely reactive. CXR normal, will send a UA for completeness.    Diet: NPO Fluids: NS DVT Prophylaxis: SCDs  Code  Status: DNR  Family Communication: husband bedside  Disposition Plan: inpatient  Time spent: 25  Cairo Agostinelli M. Elvera Lennox, MD [facility] Pager [phone]  If 7PM-7AM, please contact night-coverage [email or web address] Password [email or web address] 04/30/2013, 1:53 PM

## 2013-04-30 NOTE — ED Notes (Signed)
Bed: WA17 Expected date:  Expected time:  Means of arrival:  Comments: EMS/dementia/fall/right hip pain

## 2013-04-30 NOTE — Consult Note (Signed)
Reason for Consult: Right Hip IT Fracture Referring Physician: [facility]  Deborah Hunt is an 79 y.o. female.  HPI: Patient was admitted for a right hip fracture. She has a history of dementia and falls. She and her husband are residents of [facility]. She requires skilled care and when ambulating earlier today with assistance fell on her right hip. She had immediate pain in that region. She did not have change in LOC. No trauma to her head or other areas. Dr. Lequita Halt pinned her left hip in 2013. Denies SOB, CP, or calf pain. Husband is at the bedside.   Past Medical History  Diagnosis Date  . Senile dementia with depressive features 09/25/2012  . GERD (gastroesophageal reflux disease)   . Diverticulosis   . Arthritis   . Osteoporosis   . Complication of anesthesia     caused brain disease  . Paralysis agitans 09/25/2012  . Unspecified constipation 09/25/2012  . Pain in joint, pelvic region and thigh 09/25/2012.  Marland Kitchen Senile osteoporosis 09/25/2012  . Other orthopedic aftercare(V54.89) 09/25/2012    Past Surgical History  Procedure Laterality Date  . Tubal ligation    . Nipple removal      from left breast  . Abdominal hysterectomy    . Eye surgery      bilateral cataract surgery  . Hip pinning,cannulated Left 09/14/2012    Procedure: CANNULATED HIP PINNING;  Surgeon: [name], MD;  Location: [facility];  Service: Orthopedics;  Laterality: Left;    Family History  Problem Relation Age of Onset  . Cancer Sister   . Cancer Mother   . Heart attack Father     Social History:  reports that she quit smoking about 20 years ago. She has never used smokeless tobacco. She reports that she does not drink alcohol or use illicit drugs.  Allergies:  Allergies  Allergen Reactions  . Lidocaine   . Other Other (See Comments)    Novocaine  . Penicillins   . Sulfa Antibiotics     Medications: I have reviewed the patient's current medications.  Results for orders placed  during the hospital encounter of 04/30/13 (from the past 48 hour(s))  BASIC METABOLIC PANEL     Status: Abnormal   Collection Time    04/30/13 12:00 PM      Result Value Range   Sodium 143  135 - 145 mEq/L   Potassium 3.5  3.5 - 5.1 mEq/L   Chloride 104  96 - 112 mEq/L   CO2 27  19 - 32 mEq/L   Glucose, Bld 88  70 - 99 mg/dL   BUN 11  6 - 23 mg/dL   Creatinine, Ser 4.54  0.50 - 1.10 mg/dL   Calcium 9.8  8.4 - 09.8 mg/dL   GFR calc non Af Amer 81 (*) >90 mL/min   GFR calc Af Amer >90  >90 mL/min   Comment: (NOTE)     The eGFR has been calculated using the CKD EPI equation.     This calculation has not been validated in all clinical situations.     eGFR's persistently <90 mL/min signify possible Chronic Kidney     Disease.  CBC WITH DIFFERENTIAL     Status: Abnormal   Collection Time    04/30/13 12:00 PM      Result Value Range   WBC 10.6 (*) 4.0 - 10.5 K/uL   RBC 4.44  3.87 - 5.11 MIL/uL   Hemoglobin 14.4  12.0 -  15.0 g/dL   HCT 16.1  09.6 - 04.5 %   MCV 95.3  78.0 - 100.0 fL   MCH 32.4  26.0 - 34.0 pg   MCHC 34.0  30.0 - 36.0 g/dL   RDW 40.9  81.1 - 91.4 %   Platelets 235  150 - 400 K/uL   Neutrophils Relative % 80 (*) 43 - 77 %   Neutro Abs 8.5 (*) 1.7 - 7.7 K/uL   Lymphocytes Relative 15  12 - 46 %   Lymphs Abs 1.6  0.7 - 4.0 K/uL   Monocytes Relative 4  3 - 12 %   Monocytes Absolute 0.4  0.1 - 1.0 K/uL   Eosinophils Relative 1  0 - 5 %   Eosinophils Absolute 0.1  0.0 - 0.7 K/uL   Basophils Relative 0  0 - 1 %   Basophils Absolute 0.0  0.0 - 0.1 K/uL  PROTIME-INR     Status: None   Collection Time    04/30/13 12:00 PM      Result Value Range   Prothrombin Time 12.2  11.6 - 15.2 seconds   INR 0.92  0.00 - 1.49  TYPE AND SCREEN     Status: None   Collection Time    04/30/13 12:00 PM      Result Value Range   ABO/RH(D) B NEG     Antibody Screen NEG     Sample Expiration 05/03/2013    URINALYSIS, ROUTINE W REFLEX MICROSCOPIC     Status: Abnormal   Collection  Time    04/30/13  6:01 PM      Result Value Range   Color, Urine YELLOW  YELLOW   APPearance CLOUDY (*) CLEAR   Specific Gravity, Urine 1.016  1.005 - 1.030   pH 6.5  5.0 - 8.0   Glucose, UA NEGATIVE  NEGATIVE mg/dL   Hgb urine dipstick TRACE (*) NEGATIVE   Bilirubin Urine NEGATIVE  NEGATIVE   Ketones, ur NEGATIVE  NEGATIVE mg/dL   Protein, ur NEGATIVE  NEGATIVE mg/dL   Urobilinogen, UA 0.2  0.0 - 1.0 mg/dL   Nitrite POSITIVE (*) NEGATIVE   Leukocytes, UA LARGE (*) NEGATIVE  URINE MICROSCOPIC-ADD ON     Status: None   Collection Time    04/30/13  6:01 PM      Result Value Range   Squamous Epithelial / LPF RARE  RARE   WBC, UA 11-20  <3 WBC/hpf   RBC / HPF 0-2  <3 RBC/hpf   Bacteria, UA RARE  RARE    Dg Chest 1 View  04/30/2013   CLINICAL DATA:  Larey Seat today, hip pain  EXAM: CHEST - 1 VIEW  COMPARISON:  09/13/2012  FINDINGS: Heart size within normal limits. No evidence of consolidation or effusion. Bony thorax is intact.  IMPRESSION: No active disease.   Electronically Signed   By: Esperanza Heir M.D.   On: 04/30/2013 11:37   Dg Hip Bilateral W/pelvis  04/30/2013   CLINICAL DATA:  Fall with bilateral hip pain. History prior left hip fracture with pinning.  EXAM: BILATERAL HIP WITH PELVIS - 4+ VIEW  COMPARISON:  09/14/2012  FINDINGS: Acute intertrochanteric fracture of the right hip demonstrates displacement. No evidence of dislocation. Screws across the left femoral neck shows stable positioning and there is no evidence of acute left hip fracture. The left femoral neck is very foreshortened on the radiographs which is felt to primarily relate to leg rotation. If there is any further remaining  suspicion of potential acute injury to left hip, repeat dedicated films would be recommended in a better position. The bony pelvis is intact.  IMPRESSION: 1. Acute intertrochanteric fracture of the right hip. 2. No acute fracture is identified on the left with previously placed screws demonstrating  stable positioning. The left femoral neck is considerably foreshortened on the images due to rotation. If there remains any significant left hip pain, recommend repeat hip radiographs.   Electronically Signed   By: Irish Lack M.D.   On: 04/30/2013 11:50    Review of Systems  Constitutional: Negative.   HENT: Negative.   Eyes: Negative.   Respiratory: Negative.   Cardiovascular: Negative.   Gastrointestinal: Negative.   Genitourinary: Negative.   Musculoskeletal: Positive for falls and joint pain.  Skin: Negative.   Neurological: Negative.   Endo/Heme/Allergies: Negative.   Psychiatric/Behavioral: Negative.    Blood pressure 159/71, pulse 70, temperature 98.4 F (36.9 C), temperature source Oral, resp. rate 20, SpO2 95.00%. Physical Exam  Constitutional: She appears well-developed.  HENT:  Head: Normocephalic.  Eyes: EOM are normal.  Neck: Normal range of motion.  Cardiovascular: Normal rate and intact distal pulses.   Respiratory: Effort normal.  GI: Soft.  Genitourinary:  deferred  Musculoskeletal: She exhibits edema and tenderness.  Pain in right hip region  Neurological: She is alert.  Skin: Skin is warm and dry.  Psychiatric: Her behavior is normal.    Assessment/Plan: Right Hip IT Fracture: Admit to Hospitalist. Plan for ORIF Tomorrow by Tomasita Crumble. NPO after MN. Bucks traction RLE with 5 lbs. Plexi Pulses for DVT prevention. Pt and husband question encouraged and answered.   STILWELL, BRYSON L 04/30/2013, 7:19 PM

## 2013-05-01 ENCOUNTER — Encounter (HOSPITAL_COMMUNITY): Payer: Medicare Other | Admitting: Anesthesiology

## 2013-05-01 ENCOUNTER — Encounter (HOSPITAL_COMMUNITY)
Admission: EM | Disposition: A | Discharge status: Skilled Nursing Facility | Payer: Self-pay | Source: Home / Self Care | Attending: Internal Medicine

## 2013-05-01 ENCOUNTER — Inpatient Hospital Stay (HOSPITAL_COMMUNITY): Payer: Medicare Other | Admitting: Anesthesiology

## 2013-05-01 ENCOUNTER — Inpatient Hospital Stay (HOSPITAL_COMMUNITY): Payer: Medicare Other

## 2013-05-01 ENCOUNTER — Encounter (HOSPITAL_COMMUNITY): Payer: Self-pay | Admitting: *Deleted

## 2013-05-01 HISTORY — PX: FEMUR IM NAIL: SHX1597

## 2013-05-01 LAB — MRSA PCR SCREENING: MRSA by PCR: NEGATIVE

## 2013-05-01 SURGERY — INSERTION, INTRAMEDULLARY ROD, FEMUR
Anesthesia: General | Laterality: Right | Wound class: Clean

## 2013-05-01 MED ORDER — BUPIVACAINE IN DEXTROSE 0.75-8.25 % IT SOLN
INTRATHECAL | Status: DC | PRN
  Administered 2013-05-01: 1.4 mL via INTRATHECAL

## 2013-05-01 MED ORDER — ASPIRIN EC 325 MG PO TBEC: 325.0000 mg | DELAYED_RELEASE_TABLET | Freq: Every day | ORAL | Status: DC

## 2013-05-01 MED ORDER — CLINDAMYCIN PHOSPHATE 600 MG/50ML IV SOLN
600.0000 mg | INTRAVENOUS | Status: AC
  Administered 2013-05-01: 600 mg via INTRAVENOUS
  Filled 2013-05-01 (×2): qty 50

## 2013-05-01 MED ORDER — ONDANSETRON HCL 4 MG PO TABS: 4.0000 mg | ORAL_TABLET | Freq: Four times a day (QID) | ORAL | Status: DC | PRN

## 2013-05-01 MED ORDER — PHENOL 1.4 % MT LIQD: 1.0000 | OROMUCOSAL | Status: DC | PRN

## 2013-05-01 MED ORDER — DOCUSATE SODIUM 100 MG PO CAPS
100.0000 mg | ORAL_CAPSULE | Freq: Two times a day (BID) | ORAL | Status: DC
  Administered 2013-05-02 – 2013-05-03 (×2): 100 mg via ORAL

## 2013-05-01 MED ORDER — HYDROCODONE-ACETAMINOPHEN 5-325 MG PO TABS: 1.0000 | ORAL_TABLET | Freq: Four times a day (QID) | ORAL | Status: DC | PRN

## 2013-05-01 MED ORDER — ONDANSETRON HCL 4 MG/2ML IJ SOLN: 4.0000 mg | Freq: Four times a day (QID) | INTRAMUSCULAR | Status: DC | PRN

## 2013-05-01 MED ORDER — METOCLOPRAMIDE HCL 10 MG PO TABS: 5.0000 mg | ORAL_TABLET | Freq: Three times a day (TID) | ORAL | Status: DC | PRN

## 2013-05-01 MED ORDER — FENTANYL CITRATE 0.05 MG/ML IJ SOLN
25.0000 ug | INTRAMUSCULAR | Status: DC | PRN
  Administered 2013-05-01 (×4): 25 ug via INTRAVENOUS

## 2013-05-01 MED ORDER — CIPROFLOXACIN HCL 500 MG PO TABS
500.0000 mg | ORAL_TABLET | Freq: Two times a day (BID) | ORAL | Status: DC
  Administered 2013-05-01: 500 mg via ORAL
  Filled 2013-05-01 (×4): qty 1

## 2013-05-01 MED ORDER — POLYETHYLENE GLYCOL 3350 17 G PO PACK: 17.0000 g | PACK | Freq: Every day | ORAL | Status: DC | PRN

## 2013-05-01 MED ORDER — KETAMINE HCL 10 MG/ML IJ SOLN
INTRAMUSCULAR | Status: DC | PRN
  Administered 2013-05-01 (×2): 10 mg via INTRAVENOUS

## 2013-05-01 MED ORDER — METOCLOPRAMIDE HCL 5 MG/ML IJ SOLN: 5.0000 mg | Freq: Three times a day (TID) | INTRAMUSCULAR | Status: DC | PRN

## 2013-05-01 MED ORDER — MORPHINE SULFATE 2 MG/ML IJ SOLN: 0.5000 mg | INTRAMUSCULAR | Status: DC | PRN

## 2013-05-01 MED ORDER — CLINDAMYCIN PHOSPHATE 600 MG/50ML IV SOLN: 600.0000 mg | INTRAVENOUS | Status: DC

## 2013-05-01 MED ORDER — FENTANYL CITRATE 0.05 MG/ML IJ SOLN
INTRAMUSCULAR | Status: AC
  Filled 2013-05-01: qty 2

## 2013-05-01 MED ORDER — CLINDAMYCIN PHOSPHATE 600 MG/50ML IV SOLN
600.0000 mg | Freq: Four times a day (QID) | INTRAVENOUS | Status: AC
  Administered 2013-05-02 (×2): 600 mg via INTRAVENOUS
  Filled 2013-05-01 (×2): qty 50

## 2013-05-01 MED ORDER — PHENYLEPHRINE HCL 10 MG/ML IJ SOLN
INTRAMUSCULAR | Status: DC | PRN
  Administered 2013-05-01: 80 ug via INTRAVENOUS
  Administered 2013-05-01: 40 ug via INTRAVENOUS

## 2013-05-01 MED ORDER — ONDANSETRON HCL 4 MG/2ML IJ SOLN
INTRAMUSCULAR | Status: DC | PRN
  Administered 2013-05-01: 4 mg via INTRAVENOUS

## 2013-05-01 MED ORDER — PROPOFOL INFUSION 10 MG/ML OPTIME
INTRAVENOUS | Status: DC | PRN
  Administered 2013-05-01: 50 ug/kg/min via INTRAVENOUS

## 2013-05-01 MED ORDER — SODIUM CHLORIDE 0.9 % IV SOLN
INTRAVENOUS | Status: DC
  Administered 2013-05-02: 01:00:00 via INTRAVENOUS

## 2013-05-01 MED ORDER — ACETAMINOPHEN 650 MG RE SUPP: 650.0000 mg | Freq: Four times a day (QID) | RECTAL | Status: DC | PRN

## 2013-05-01 MED ORDER — ACETAMINOPHEN 325 MG PO TABS: 650.0000 mg | ORAL_TABLET | Freq: Four times a day (QID) | ORAL | Status: DC | PRN

## 2013-05-01 MED ORDER — LACTATED RINGERS IV SOLN
INTRAVENOUS | Status: DC | PRN
  Administered 2013-05-01: 21:00:00 via INTRAVENOUS

## 2013-05-01 MED ORDER — CIPROFLOXACIN IN D5W 400 MG/200ML IV SOLN
INTRAVENOUS | Status: AC
  Filled 2013-05-01: qty 200

## 2013-05-01 MED ORDER — MENTHOL 3 MG MT LOZG: 1.0000 | LOZENGE | OROMUCOSAL | Status: DC | PRN

## 2013-05-01 MED ORDER — TRAMADOL-ACETAMINOPHEN 37.5-325 MG PO TABS: 1.0000 | ORAL_TABLET | Freq: Four times a day (QID) | ORAL | Status: DC | PRN

## 2013-05-01 MED ORDER — ASPIRIN EC 325 MG PO TBEC
325.0000 mg | DELAYED_RELEASE_TABLET | Freq: Every day | ORAL | Status: DC
  Administered 2013-05-02 – 2013-05-03 (×2): 325 mg via ORAL
  Filled 2013-05-01 (×3): qty 1

## 2013-05-01 MED ORDER — CIPROFLOXACIN IN D5W 400 MG/200ML IV SOLN
400.0000 mg | Freq: Two times a day (BID) | INTRAVENOUS | Status: DC
  Administered 2013-05-01 – 2013-05-03 (×5): 400 mg via INTRAVENOUS
  Filled 2013-05-01 (×6): qty 200

## 2013-05-01 SURGICAL SUPPLY — 38 items
BAG ZIPLOCK 12X15 (MISCELLANEOUS) ×2 IMPLANT
BANDAGE GAUZE ELAST BULKY 4 IN (GAUZE/BANDAGES/DRESSINGS) ×2 IMPLANT
BIT DRILL CANN LG 4.3MM (BIT) ×1 IMPLANT
CLOTH BEACON ORANGE TIMEOUT ST (SAFETY) ×2 IMPLANT
DRAPE INCISE IOBAN 66X45 STRL (DRAPES) ×2 IMPLANT
DRAPE STERI IOBAN 125X83 (DRAPES) ×2 IMPLANT
DRILL BIT CANN LG 4.3MM (BIT) ×2
DRSG AQUACEL AG ADV 3.5X 4 (GAUZE/BANDAGES/DRESSINGS) ×4 IMPLANT
DRSG AQUACEL AG ADV 3.5X 6 (GAUZE/BANDAGES/DRESSINGS) ×2 IMPLANT
DRSG MEPILEX BORDER 4X12 (GAUZE/BANDAGES/DRESSINGS) ×2 IMPLANT
DRSG PAD ABDOMINAL 8X10 ST (GAUZE/BANDAGES/DRESSINGS) ×4 IMPLANT
DURAPREP 26ML APPLICATOR (WOUND CARE) ×2 IMPLANT
ELECT REM PT RETURN 9FT ADLT (ELECTROSURGICAL) ×2
ELECTRODE REM PT RTRN 9FT ADLT (ELECTROSURGICAL) ×1 IMPLANT
GLOVE BIOGEL PI IND STRL 7.5 (GLOVE) ×1 IMPLANT
GLOVE BIOGEL PI IND STRL 8 (GLOVE) ×1 IMPLANT
GLOVE BIOGEL PI INDICATOR 7.5 (GLOVE) ×1
GLOVE BIOGEL PI INDICATOR 8 (GLOVE) ×1
GLOVE ECLIPSE 8.0 STRL XLNG CF (GLOVE) IMPLANT
GLOVE ORTHO TXT STRL SZ7.5 (GLOVE) ×4 IMPLANT
GLOVE SURG ORTHO 8.0 STRL STRW (GLOVE) ×2 IMPLANT
GOWN BRE IMP PREV XXLGXLNG (GOWN DISPOSABLE) ×4 IMPLANT
GOWN PREVENTION PLUS LG XLONG (DISPOSABLE) ×2 IMPLANT
GUIDEPIN 3.2X17.5 THRD DISP (PIN) ×2 IMPLANT
KIT BASIN OR (CUSTOM PROCEDURE TRAY) ×2 IMPLANT
MANIFOLD NEPTUNE II (INSTRUMENTS) ×2 IMPLANT
NAIL HIP FRACT 130D 9X180 (Orthopedic Implant) ×2 IMPLANT
PACK GENERAL/GYN (CUSTOM PROCEDURE TRAY) ×2 IMPLANT
POSITIONER SURGICAL ARM (MISCELLANEOUS) ×2 IMPLANT
SCREW BONE CORTICAL 5.0X3 (Screw) ×2 IMPLANT
SCREW LAG HIP NAIL 10.5X95 (Screw) ×2 IMPLANT
SPONGE GAUZE 4X4 12PLY (GAUZE/BANDAGES/DRESSINGS) ×2 IMPLANT
STAPLER VISISTAT 35W (STAPLE) ×2 IMPLANT
SUT VIC AB 1 CT1 27 (SUTURE) ×1
SUT VIC AB 1 CT1 27XBRD ANTBC (SUTURE) ×1 IMPLANT
SUT VIC AB 2-0 CT1 27 (SUTURE) ×2
SUT VIC AB 2-0 CT1 27XBRD (SUTURE) ×2 IMPLANT
TOWEL OR 17X26 10 PK STRL BLUE (TOWEL DISPOSABLE) ×4 IMPLANT

## 2013-05-01 NOTE — Anesthesia Procedure Notes (Signed)
Spinal  Patient location during procedure: OR Staffing Anesthesiologist: Jalayna Josten J Performed by: anesthesiologist  Preanesthetic Checklist Completed: patient identified, site marked, surgical consent, pre-op evaluation, timeout performed, IV checked, risks and benefits discussed and monitors and equipment checked Spinal Block Patient position: sitting Prep: Betadine Patient monitoring: heart rate, continuous pulse ox and blood pressure Approach: right paramedian Location: L3-4 Injection technique: single-shot Needle Needle type: Spinocan  Needle gauge: 22 G Needle length: 9 cm Additional Notes Expiration date of kit checked and confirmed. Patient tolerated procedure well, without complications. CSF clear. No paresthesia.     

## 2013-05-01 NOTE — Care Management Note (Signed)
    Page 1 of 1   05/01/2013     11:16:40 AM   CARE MANAGEMENT NOTE 05/01/2013  Patient:  Deborah Hunt, Deborah Hunt   Account Number:  [id number]  Date Initiated:  05/01/2013  Documentation initiated by:  Lorenda Ishihara  Subjective/Objective Assessment:   79 yo female admitted s/p fall with hip fracture. PTA lived at home with spouse.     Action/Plan:   Likely need SNF at d/c, await PT eval post op.   Anticipated DC Date:  05/05/2013   Anticipated DC Plan:  SKILLED NURSING FACILITY  In-house referral  Clinical Social Worker      DC Planning Services  CM consult      Choice offered to / List presented to:             Status of service:  In process, will continue to follow Medicare Important Message given?   (If response is "NO", the following Medicare IM given date fields will be blank) Date Medicare IM given:   Date Additional Medicare IM given:    Discharge Disposition:    Per UR Regulation:  Reviewed for med. necessity/level of care/duration of stay  If discussed at Long Length of Stay Meetings, dates discussed:    Comments:

## 2013-05-01 NOTE — Transfer of Care (Signed)
Immediate Anesthesia Transfer of Care Note  Patient: Deborah Hunt  Procedure(s) Performed: Procedure(s) (LRB): INTRAMEDULLARY (IM) NAIL FEMORAL (Right)  Patient Location: PACU  Anesthesia Type: General  Level of Consciousness: sedated, patient cooperative and responds to stimulation  Airway & Oxygen Therapy: Patient Spontanous Breathing and Patient connected to face mask oxgen  Post-op Assessment: Report given to PACU RN and Post -op Vital signs reviewed and stable  Post vital signs: Reviewed and stable  Complications: No apparent anesthesia complications

## 2013-05-01 NOTE — Brief Op Note (Signed)
04/30/2013 - 05/01/2013  10:18 PM  PATIENT:  Deborah Hunt  79 y.o. female  PRE-OPERATIVE DIAGNOSIS:  Right comminuted intertrochanteric femur fracture  POST-OPERATIVE DIAGNOSIS:  Right comminuted intertrochanteric femur fracture  PROCEDURE:  Procedure(s): INTRAMEDULLARY (IM) NAIL FEMORAL (Right)  SURGEON:  Surgeon(s) and Role:    * Shelda Pal, MD - Primary  PHYSICIAN ASSISTANT: None  ANESTHESIA:   spinal  EBL:  Total I/O In: 600 [I.V.:600] Out: 50 [Blood:50]  BLOOD ADMINISTERED:none  DRAINS: none   LOCAL MEDICATIONS USED:  NONE  SPECIMEN:  No Specimen  DISPOSITION OF SPECIMEN:  N/A  COUNTS:  YES  TOURNIQUET:  * No tourniquets in log *  DICTATION: .Other Dictation: Dictation Number [id number]  PLAN OF CARE: Admit to inpatient   PATIENT DISPOSITION:  PACU - hemodynamically stable.   Delay start of Pharmacological VTE agent (>24hrs) due to surgical blood loss or risk of bleeding: no

## 2013-05-01 NOTE — Progress Notes (Signed)
INITIAL NUTRITION ASSESSMENT  DOCUMENTATION CODES Per approved criteria  -Not Applicable   INTERVENTION: Provide Resource Breeze, Boost Plus, Ensure pudding, and [employer] once daily each once diet is advanced Encourage PO intake Diet advancement per MD discretion; recommend regular diet and "yes with assist" for room service  NUTRITION DIAGNOSIS: Inadequate oral intake related to poor appetite as evidenced by pt at 84% of usual body weight and by pt's report.   Goal: Pt to meet >/= 90% of their estimated nutrition needs   Monitor:  Diet advancement/PO intake Weight Labs  Reason for Assessment: Malnutrition Screening Tool, score of 3  79 y.o. female  Admitting Dx: Hip fracture, right  ASSESSMENT: 79 y.o. female has a past medical history significant for dementia, recent left hip fx in march 2013 presenting to the ED with right hip pain after a fall today. At baseline, she is ambulatory with a walker. Patient has advanced dementia and cannot contribute to the story, but per husband and chart review she had a mechanical fall this morning while brushing her teeth. Pt states that she doesn't have much of an appetite and was not eating well PTA. Pt reports drinking one, sometimes two, [employer] supplements daily PTA. Per husband, pt is not a big fan of the food at [facility]. Husband states that he buys pt Boost Plus supplements but, pt drinks them rarely. Husband states that pt used to weigh 107 lbs, lost down to 80 lbs, and has since regained some weight.  Pt at high nutritional risk due to age, low BMI, dementia, and poor appetite.   Height: Ht Readings from Last 1 Encounters:  05/01/13 4\' 10"  (1.473 m)    Weight: Wt Readings from Last 1 Encounters:  05/01/13 90 lb 6.2 oz (41 kg)    Ideal Body Weight: 97 lbs  % Ideal Body Weight: 93%  Wt Readings from Last 10 Encounters:  05/01/13 90 lb 6.2 oz (41 kg)  05/01/13 90 lb 6.2 oz (41 kg)  02/15/13 85 lb  (38.556 kg)  09/13/12 88 lb 10 oz (40.2 kg)  09/13/12 88 lb 10 oz (40.2 kg)    Usual Body Weight: 107 lbs (per pt's husband)  % Usual Body Weight: 84%  BMI:  Body mass index is 18.9 kg/(m^2).  Estimated Nutritional Needs: Kcal: 1350-1550 Protein: 50-58 grams Fluid: 1.3-1.5 L/day  Skin: WDL  Diet Order: NPO  EDUCATION NEEDS: -No education needs identified at this time   Intake/Output Summary (Last 24 hours) at 05/01/13 1501 Last data filed at 05/01/13 1357  Gross per 24 hour  Intake 1876.67 ml  Output    675 ml  Net 1201.67 ml    Last BM: PTA  Labs:   Recent Labs Lab 04/30/13 1200  NA 143  K 3.5  CL 104  CO2 27  BUN 11  CREATININE 0.60  CALCIUM 9.8  GLUCOSE 88    CBG (last 3)  No results found for this basename: GLUCAP,  in the last 72 hours  Scheduled Meds: . carbidopa-levodopa  1.5 tablet Oral TID WC   And  . carbidopa-levodopa  1 tablet Oral Q24H  . ciprofloxacin  400 mg Intravenous Q12H  . donepezil  10 mg Oral QHS    Continuous Infusions: . sodium chloride 125 mL/hr at 05/01/13 0335    Past Medical History  Diagnosis Date  . Senile dementia with depressive features 09/25/2012  . GERD (gastroesophageal reflux disease)   . Diverticulosis   . Arthritis   .  Osteoporosis   . Complication of anesthesia     caused brain disease  . Paralysis agitans 09/25/2012  . Unspecified constipation 09/25/2012  . Pain in joint, pelvic region and thigh 09/25/2012.  Marland Kitchen Senile osteoporosis 09/25/2012  . Other orthopedic aftercare(V54.89) 09/25/2012    Past Surgical History  Procedure Laterality Date  . Tubal ligation    . Nipple removal      from left breast  . Abdominal hysterectomy    . Eye surgery      bilateral cataract surgery  . Hip pinning,cannulated Left 09/14/2012    Procedure: CANNULATED HIP PINNING;  Surgeon: [name], MD;  Location: [facility];  Service: Orthopedics;  Laterality: Left;    Ian Malkin RD, LDN Inpatient Clinical  Dietitian Pager: [phone] After Hours Pager: [phone]

## 2013-05-01 NOTE — Progress Notes (Signed)
Clinical Social Work Department BRIEF PSYCHOSOCIAL ASSESSMENT 05/01/2013  Patient:  MIRISSA, LOPRESTI     Account Number:  [id number]     Admit date:  04/30/2013  Clinical Social Worker:  Candie Chroman  Date/Time:  05/01/2013 03:48 PM  Referred by:  Physician  Date Referred:  05/01/2013 Referred for  SNF Placement   Other Referral:   Interview type:  Family Other interview type:    PSYCHOSOCIAL DATA Living Status:  FACILITY Admitted from facility:  [facility] Level of care:  Skilled Nursing Facility Primary support name:  Modine Oppenheimer Primary support relationship to patient:  SPOUSE Degree of support available:   supportive    CURRENT CONCERNS Current Concerns  Post-Acute Placement   Other Concerns:    SOCIAL WORK ASSESSMENT / PLAN Pt is an 79 yr old female admitted from [facility] with a ( R ) hip fx. Pt will have surgery today. CSW met with pt / brother at bedside. Family expects pt to return to SNF following hospital d/c. [facility] contacted and d/c plan has been confirmed. CSW will follow to assist with d/c planning back to SNF when stable.   Assessment/plan status:  Psychosocial Support/Ongoing Assessment of Needs Other assessment/ plan:   Information/referral to community resources:   None needed at this time.    PATIENT'S/FAMILY'S RESPONSE TO PLAN OF CARE: Family would like pt to return to [facility] when stable.    Cori Razor LCSW [phone]

## 2013-05-01 NOTE — Preoperative (Signed)
Beta Blockers   Reason not to administer Beta Blockers:Not Applicable 

## 2013-05-01 NOTE — Anesthesia Postprocedure Evaluation (Signed)
  Anesthesia Post-op Note  Patient: Deborah Hunt  Procedure(s) Performed: Procedure(s) (LRB): INTRAMEDULLARY (IM) NAIL FEMORAL (Right)  Patient Location: PACU  Anesthesia Type: Spinal  Level of Consciousness: awake and alert   Airway and Oxygen Therapy: Patient Spontanous Breathing  Post-op Pain: mild  Post-op Assessment: Post-op Vital signs reviewed, Patient's Cardiovascular Status Stable, Respiratory Function Stable, Patent Airway and No signs of Nausea or vomiting  Last Vitals:  Filed Vitals:   05/01/13 2245  BP: 134/82  Pulse: 75  Temp:   Resp: 20    Post-op Vital Signs: stable   Complications: No apparent anesthesia complications

## 2013-05-01 NOTE — Progress Notes (Signed)
[facility] PROGRESS NOTE  Deborah Hunt ZOX:[id number] DOB: 1927-03-12 DOA: 04/30/2013 PCP: Thora Lance, MD  Assessment/Plan: Right hip fracture - s/p mechanical fall.   - orthopedic surgery consulted by EDP.  - PT consult post op  - nutrition consult post op  Dementia - continue home medications  Parkinson disease - continue home medications  UTI - on Cipro, awaiting cultures. Leukocytosis - minimal, likely reactive vs UTI. CXR normal, will send a UA for completeness.   Diet: NPO Fluids: NS DVT Prophylaxis: SCDs  Code Status: DNR Family Communication: none  Disposition Plan: OR today  Consultants:  Orthopedic surgery  Procedures:  none   Antibiotics  Anti-infectives   Start     Dose/Rate Route Frequency Ordered Stop   05/01/13 0130  ciprofloxacin (CIPRO) tablet 500 mg     500 mg Oral 2 times daily 05/01/13 0111       Antibiotics Given (last 72 hours)   Date/Time Action Medication Dose   05/01/13 0335 Given   ciprofloxacin (CIPRO) tablet 500 mg 500 mg     HPI/Subjective: - confused, flat affect  Objective: Filed Vitals:   04/30/13 1357 04/30/13 1548 04/30/13 2122 05/01/13 0530  BP: 163/60 159/71 115/68 152/69  Pulse: 81 70 69 77  Temp: 97.6 F (36.4 C) 98.4 F (36.9 C) 98.6 F (37 C) 98.1 F (36.7 C)  TempSrc: Oral Oral Oral Axillary  Resp: 22 20 20 20   Height:    4\' 10"  (1.473 m)  Weight:    41 kg (90 lb 6.2 oz)  SpO2: 100% 95% 95% 94%    Intake/Output Summary (Last 24 hours) at 05/01/13 0754 Last data filed at 05/01/13 0530  Gross per 24 hour  Intake 1876.67 ml  Output    400 ml  Net 1476.67 ml   Filed Weights   05/01/13 0530  Weight: 41 kg (90 lb 6.2 oz)    Exam:   General:  NAD, more alert this morning, confused  Cardiovascular: regular rate and rhythm, without MRG  Respiratory: good air movement, clear to auscultation throughout, no wheezing, ronchi or rales  Abdomen: soft, not tender to palpation, positive bowel  sounds  MSK: no peripheral edema  Neuro: non focal  Data Reviewed: Basic Metabolic Panel:  Recent Labs Lab 04/30/13 1200  NA 143  K 3.5  CL 104  CO2 27  GLUCOSE 88  BUN 11  CREATININE 0.60  CALCIUM 9.8   CBC:  Recent Labs Lab 04/30/13 1200  WBC 10.6*  NEUTROABS 8.5*  HGB 14.4  HCT 42.3  MCV 95.3  PLT 235    Recent Results (from the past 240 hour(s))  MRSA PCR SCREENING     Status: None   Collection Time    05/01/13  3:33 AM      Result Value Range Status   MRSA by PCR NEGATIVE  NEGATIVE Final   Comment:            The GeneXpert MRSA Assay (FDA     approved for NASAL specimens     only), is one component of a     comprehensive MRSA colonization     surveillance program. It is not     intended to diagnose MRSA     infection nor to guide or     monitor treatment for     MRSA infections.     Studies: Dg Chest 1 View  04/30/2013   CLINICAL DATA:  Larey Seat today, hip pain  EXAM: CHEST -  1 VIEW  COMPARISON:  09/13/2012  FINDINGS: Heart size within normal limits. No evidence of consolidation or effusion. Bony thorax is intact.  IMPRESSION: No active disease.   Electronically Signed   By: Esperanza Heir M.D.   On: 04/30/2013 11:37   Dg Hip Bilateral W/pelvis  04/30/2013   CLINICAL DATA:  Fall with bilateral hip pain. History prior left hip fracture with pinning.  EXAM: BILATERAL HIP WITH PELVIS - 4+ VIEW  COMPARISON:  09/14/2012  FINDINGS: Acute intertrochanteric fracture of the right hip demonstrates displacement. No evidence of dislocation. Screws across the left femoral neck shows stable positioning and there is no evidence of acute left hip fracture. The left femoral neck is very foreshortened on the radiographs which is felt to primarily relate to leg rotation. If there is any further remaining suspicion of potential acute injury to left hip, repeat dedicated films would be recommended in a better position. The bony pelvis is intact.  IMPRESSION: 1. Acute  intertrochanteric fracture of the right hip. 2. No acute fracture is identified on the left with previously placed screws demonstrating stable positioning. The left femoral neck is considerably foreshortened on the images due to rotation. If there remains any significant left hip pain, recommend repeat hip radiographs.   Electronically Signed   By: Irish Lack M.D.   On: 04/30/2013 11:50    Scheduled Meds: . carbidopa-levodopa  1.5 tablet Oral TID WC   And  . carbidopa-levodopa  1 tablet Oral Q24H  . ciprofloxacin  500 mg Oral BID  . donepezil  10 mg Oral QHS   Continuous Infusions: . sodium chloride 125 mL/hr at 05/01/13 0335    Principal Problem:   Hip fracture, right Active Problems:   Parkinson disease   Osteoporosis, unspecified   Depression due to dementia   DNR (do not resuscitate)  Time spent: 25  Pamella Pert, MD [facility] Pager [phone]. If 7 PM - 7 AM, please contact night-coverage at [email or web address], password [facility] 05/01/2013, 7:54 AM  LOS: 1 day

## 2013-05-01 NOTE — Progress Notes (Signed)
Patient ID: Deborah Hunt, female   DOB: 26-Jan-1927, 79 y.o.   MRN: [id number] Subjective: Comfortable in room with her brother, particularly without movement    Patient reports pain as mild, currently  Objective:   VITALS:   Filed Vitals:   05/01/13 0530  BP: 152/69  Pulse: 77  Temp: 98.1 F (36.7 C)  Resp: 20    {physical exam: NVI Right leg shortened and externally rotated  LABS  Recent Labs  04/30/13 1200  HGB 14.4  HCT 42.3  WBC 10.6*  PLT 235     Recent Labs  04/30/13 1200  NA 143  K 3.5  BUN 11  CREATININE 0.60  GLUCOSE 88     Recent Labs  04/30/13 1200  INR 0.92     Assessment/Plan:   Comminuted right intertrochanteric femur fracture   {Plan: Admitted yesterday for fracture Cleared for OR today NPO Consent ordered Reviewed with her brother  Resides at [facility], bed held

## 2013-05-01 NOTE — Anesthesia Preprocedure Evaluation (Addendum)
Anesthesia Evaluation  Patient identified by MRN, date of birth, ID band Patient awake  General Assessment Comment:Husband believes anesthesia caused mental deterioration.  Reviewed: Allergy & Precautions, H&P , NPO status , Patient's Chart, lab work & pertinent test results  History of Anesthesia Complications (+) history of anesthetic complications  Airway Mallampati: II TM Distance: >3 FB Neck ROM: Full    Dental no notable dental hx.    Pulmonary  CXR: No active disease. breath sounds clear to auscultation  Pulmonary exam normal       Cardiovascular negative cardio ROS  Rhythm:Regular Rate:Normal  ECG 04-30-13 reviewed.   Neuro/Psych PSYCHIATRIC DISORDERS Depression dementia    GI/Hepatic Neg liver ROS, GERD-  ,  Endo/Other  negative endocrine ROS  Renal/GU negative Renal ROS  negative genitourinary   Musculoskeletal negative musculoskeletal ROS (+)   Abdominal   Peds negative pediatric ROS (+)  Hematology negative hematology ROS (+)   Anesthesia Other Findings   Reproductive/Obstetrics negative OB ROS                          Anesthesia Physical Anesthesia Plan  ASA: III  Anesthesia Plan: General and Spinal   Post-op Pain Management:    Induction: Intravenous  Airway Management Planned:   Additional Equipment:   Intra-op Plan:   Post-operative Plan:   Informed Consent: I have reviewed the patients History and Physical, chart, labs and discussed the procedure including the risks, benefits and alternatives for the proposed anesthesia with the patient or authorized representative who has indicated his/her understanding and acceptance.   Dental advisory given  Plan Discussed with: CRNA  Anesthesia Plan Comments: (Lidocaine listed as allergy. After chart review, it seemed to be a novacaine allergey. Seemed to do fine with spinal anesthesia on 09-14-12  Discussed plan of  spinal with husband who believes she did OK with that in March.)      Anesthesia Quick Evaluation

## 2013-05-02 ENCOUNTER — Encounter (HOSPITAL_COMMUNITY): Payer: Self-pay | Admitting: Orthopedic Surgery

## 2013-05-02 LAB — CBC
HCT: 25.1 % — ABNORMAL LOW (ref 36.0–46.0)
Hemoglobin: 9.5 g/dL — ABNORMAL LOW (ref 12.0–15.0)
Hemoglobin: 8.5 g/dL — ABNORMAL LOW (ref 12.0–15.0)
MCH: 31.6 pg (ref 26.0–34.0)
MCHC: 35.1 g/dL (ref 30.0–36.0)
MCHC: 33.9 g/dL (ref 30.0–36.0)
MCV: 93.3 fL (ref 78.0–100.0)
RBC: 2.9 MIL/uL — ABNORMAL LOW (ref 3.87–5.11)
RDW: 12.2 % (ref 11.5–15.5)
WBC: 10.5 10*3/uL (ref 4.0–10.5)

## 2013-05-02 LAB — BASIC METABOLIC PANEL
Chloride: 102 mEq/L (ref 96–112)
GFR calc Af Amer: >90 mL/min (ref >90)
GFR calc non Af Amer: 89 mL/min — ABNORMAL LOW (ref >90)
Glucose, Bld: 92 mg/dL (ref 70–99)
Potassium: 3 mEq/L — ABNORMAL LOW (ref 3.5–5.1)
Sodium: 135 mEq/L (ref 135–145)

## 2013-05-02 MED ORDER — POTASSIUM CHLORIDE CRYS ER 20 MEQ PO TBCR
40.0000 meq | EXTENDED_RELEASE_TABLET | Freq: Two times a day (BID) | ORAL | Status: AC
  Administered 2013-05-02 (×2): 40 meq via ORAL
  Filled 2013-05-02 (×2): qty 2

## 2013-05-02 MED ORDER — BOOST / RESOURCE BREEZE PO LIQD
1.0000 | Freq: Two times a day (BID) | ORAL | Status: DC
  Administered 2013-05-02: 12:00:00 1 via ORAL

## 2013-05-02 NOTE — Progress Notes (Signed)
Assumed care of pt at this time.  Agree with previously charted assessment.  Pt resting in bed, no complaints.  Brother and husband at bedside, plan of care reviewed with pt and family.  Will monitor.  Ardyth Gal, RN 05/02/2013

## 2013-05-02 NOTE — Evaluation (Signed)
Occupational Therapy Evaluation Patient Details Name: Deborah Hunt MRN: [id number] DOB: 11-22-1926 Today's Date: 05/02/2013 Time: 4098-1191 OT Time Calculation (min): 23 min  OT Assessment / Plan / Recommendation History of present illness Pt is an 79 year old female who sustained a R hip fx which was surgically managed with IM Nail and NWB.  She has a h/o dementia and she sustained a L hip fx in 3/13.     Clinical Impression   Pt was admitted for the above.  At time of eval, she was lethargic.  She does have a h/o dementia.  Will follow her in acute on a trial basis with limited adl goals.  Will continue to monitor and change frequency and plan as appropriate.      OT Assessment  Patient needs continued OT Services (trial of OT)    Follow Up Recommendations  SNF    Barriers to Discharge      Equipment Recommendations   (to be further assessed, ?3:1, depending upon progress)    Recommendations for Other Services    Frequency  Min 1X/week    Precautions / Restrictions Precautions Precautions: Fall Restrictions Weight Bearing Restrictions: Yes RLE Weight Bearing: Non weight bearing   Pertinent Vitals/Pain No signs of pain noted    ADL  Transfers/Ambulation Related to ADLs: Sat eob with A x 2; 0% for mobility and A x 2 for static sitting:  pt 60-75%.  Pt tends to lean L, and this is reported as being present prior to admission ADL Comments: Pt needs total A for all adls at this time, A x 2 for LB and all mobility.  Brother offered pt a sip of water and she attempted to help hold cup.  She could not control liquid and also holds liquid in mouth.  Tactile cues given to swallow.      OT Diagnosis: Generalized weakness  OT Problem List: Decreased strength;Decreased activity tolerance;Impaired balance (sitting and/or standing);Decreased cognition;Decreased safety awareness OT Treatment Interventions: Self-care/ADL training;Patient/family education;Balance training;Therapeutic  activities   OT Goals(Current goals can be found in the care plan section) Acute Rehab OT Goals Patient Stated Goal: unable OT Goal Formulation: With family Time For Goal Achievement: 05/09/13 Potential to Achieve Goals: Fair ADL Goals Pt Will Perform Grooming: with min assist;sitting (unsupported) Pt Will Transfer to Toilet: with +2 assist;stand pivot transfer;bedside commode (pt 40%, NWB on R) Additional ADL Goal #1: Pt will perform bed mobility with A x 2, pt 30% in preparation for adls/transfer to 3:1  Visit Information  Last OT Received On: 05/02/13 Reason Eval/Treat Not Completed: Other (comment) History of Present Illness: Pt is an 79 year old female who sustained a R hip fx which was surgically managed with IM Nail and NWB.  She has a h/o dementia and she sustained a L hip fx in 3/13.         Prior Functioning     Home Living Family/patient expects to be discharged to:: Skilled nursing facility Prior Function Level of Independence: Needs assistance Comments: pt performed some adls and limited walking with walker Communication Communication:  (pt is very soft spoken and difficult to understand at times) Pt did not head to answer questions at times        Vision/Perception     Cognition  Cognition Arousal/Alertness: Awake/alert Behavior During Therapy:  (sleepy) Overall Cognitive Status: Impaired/Different from baseline (h/o cog problems; but decreased alertness) Pt kept eyes closed a lot of the session   Extremity/Trunk Assessment Upper Extremity  Assessment Upper Extremity Assessment: Generalized weakness (performed AAROM bil shoulders; grip 3/5; wrists (ulnar styloids are prominent--no h/o fxs)     Mobility Bed Mobility Bed Mobility: Supine to Sit Supine to Sit: 1: +2 Total assist Supine to Sit: Patient Percentage: 0% Details for Bed Mobility Assistance: assist for trunk and legs     Exercise     Balance Balance Balance Assessed: Yes Static Sitting  Balance Static Sitting - Balance Support: Right upper extremity supported;Left upper extremity supported;No upper extremity supported (variable) Static Sitting - Level of Assistance: 1: +2 Total assist;Patient percentage (comment) (pt 60-85%; leans L)  Assisted to weight shift closer to midline Static Sitting - Comment/# of Minutes: 10 minutes   End of Session OT - End of Session Activity Tolerance: Patient limited by lethargy Patient left: in bed;with call bell/phone within reach;with family/visitor present  GO     Sollie Vultaggio 05/02/2013, 4:51 PM [name], OTR/L [phone] 05/02/2013

## 2013-05-02 NOTE — Evaluation (Signed)
Clinical/Bedside Swallow Evaluation Patient Details  Name: Deborah Hunt MRN: [id number] Date of Birth: [birth year]  Today's Date: 05/02/2013 Time: 1215-1247 SLP Time Calculation (min): 32 min  Past Medical History:  Past Medical History  Diagnosis Date  . Senile dementia with depressive features 09/25/2012  . GERD (gastroesophageal reflux disease)   . Diverticulosis   . Arthritis   . Osteoporosis   . Complication of anesthesia     caused brain disease  . Paralysis agitans 09/25/2012  . Unspecified constipation 09/25/2012  . Pain in joint, pelvic region and thigh 09/25/2012.  Marland Kitchen Senile osteoporosis 09/25/2012  . Other orthopedic aftercare(V54.89) 09/25/2012   Past Surgical History:  Past Surgical History  Procedure Laterality Date  . Tubal ligation    . Nipple removal      from left breast  . Abdominal hysterectomy    . Eye surgery      bilateral cataract surgery  . Hip pinning,cannulated Left 09/14/2012    Procedure: CANNULATED HIP PINNING;  Surgeon: [name], MD;  Location: [facility];  Service: Orthopedics;  Laterality: Left;  . Femur im nail Right 05/01/2013    Procedure: INTRAMEDULLARY (IM) NAIL FEMORAL;  Surgeon: Shelda Pal, MD;  Location: [facility];  Service: Orthopedics;  Laterality: Right;   HPI:  79 y.o. female has a past medical history significant for Parkinson's, dementia, GERD,cervical spondylosis C4-5, C5-6, recent left hip fx in march 2013 presenting to the ED with right hip pain after a fall today.  Patient has advanced dementia and cannot contribute to the story, but per husband and chart review she had a mechanical fall this morning while brushing her teeth.  Underwent INTRAMEDULLARY (IM) NAIL FEMORAL 10/27.  CXR No active disease.  Pt. had outpatient MBS 02/11/12 due to frequent coughing with water and pills.  Penetration of thin barium while taking pill with strong cough.  Pt.'s brother reported pt. holds foo/liquid and described a piecemeal pattern to  transiting po's to posterior oral cavity.    Assessment / Plan / Recommendation Clinical Impression  Pt. seen for swallow assessment with brother at bedside.  She is lethargic as surgery was last night.  Brother reports pt. holds foo/liquid and described a piecemeal pattern to transiting po's to posterior oral cavity in the three weeks he has observed her prior to this admission.  She required intermittent verbal/tactile/visual stimulation throughout assessment.  Oral dysphagia present including oral holding, delayed manipulation and transit and suspect a piecemeal swallow pattern.  No overt pharyngeal deficits exhibited and do not suspect significant pharyngeal delays, however difficult to assess containment in oral cavity prior to swallow from a bedside observation.  Effortful swallow intermittently observed with pt. denial of odnophagia.  Multiple pharyngeal swallow could represent residual. Recommend a Dys 1 diet texture and thin liquids only when pt. alert enough, pills whole in applesauce, small straw sips ok, sit upright.  SLP will follow pt. for safety with po's and ability to upgrade solids or need for objective evaluation.    Aspiration Risk  Moderate    Diet Recommendation Dysphagia 1 (Puree);Thin liquid   Liquid Administration via: Cup;Straw Medication Administration: Whole meds with puree Supervision: Full supervision/cueing for compensatory strategies;Staff to assist with self feeding Compensations: Slow rate;Small sips/bites;Check for pocketing Postural Changes and/or Swallow Maneuvers: Seated upright 90 degrees;Upright 30-60 min after meal    Other  Recommendations Oral Care Recommendations: Oral care BID   Follow Up Recommendations   (TBD, at friend's home, skill level not known)  Frequency and Duration min 2x/week  2 weeks   Pertinent Vitals/Pain No pain    SL     Swallow Study         Oral/Motor/Sensory Function Overall Oral Motor/Sensory Function:  (not able  to fully assess due to lethargy)   Ice Chips Ice chips: Not tested   Thin Liquid Thin Liquid: Impaired Presentation: Straw;Spoon Oral Phase Impairments: Impaired anterior to posterior transit;Reduced lingual movement/coordination Oral Phase Functional Implications: Oral holding;Prolonged oral transit Pharyngeal  Phase Impairments: Multiple swallows    Nectar Thick Nectar Thick Liquid: Not tested   Honey Thick Honey Thick Liquid: Not tested   Puree Puree: Impaired Oral Phase Functional Implications: Oral holding;Prolonged oral transit Pharyngeal Phase Impairments: Multiple swallows   Solid   GO    Solid: Not tested       Royce Macadamia M.Ed [employer] [phone]  05/02/2013

## 2013-05-02 NOTE — Evaluation (Signed)
Physical Therapy Evaluation Patient Details Name: Deborah Hunt MRN: [id number] DOB: Mar 24, 1927 Today's Date: 05/02/2013 Time: 1545-1610 PT Time Calculation (min): 25 min  PT Assessment / Plan / Recommendation History of Present Illness  Pt is an 79 year old female who sustained a R hip fx which was surgically managed with IM Nail and NWB.  She has a h/o dementia and she sustained a L hip fx in 3/13.    Clinical Impression  Pt appeared to tolerate mobility sitting on edge. At times responded to family. Pt will benefit from PT to address problems. Pt will be limited to transfers only due to NWB status.     PT Assessment  Patient needs continued PT services    Follow Up Recommendations  SNF    Does the patient have the potential to tolerate intense rehabilitation      Barriers to Discharge        Equipment Recommendations  None recommended by PT    Recommendations for Other Services     Frequency Min 3X/week    Precautions / Restrictions Precautions Precautions: Fall Restrictions Weight Bearing Restrictions: Yes RLE Weight Bearing: Non weight bearing   Pertinent Vitals/Pain Pt would hold R thigh at times. Stated"don't hurt me" x 1      Mobility  Bed Mobility Bed Mobility: Supine to Sit;Sit to Supine Supine to Sit: 1: +2 Total assist Supine to Sit: Patient Percentage: 0% Sit to Supine: 1: +2 Total assist Sit to Supine: Patient Percentage: 0% Details for Bed Mobility Assistance: assist for trunk and legs Transfers Transfers: Not assessed    Exercises     PT Diagnosis: Difficulty walking;Acute pain  PT Problem List: Decreased strength;Decreased range of motion;Decreased activity tolerance;Decreased balance;Decreased mobility;Decreased cognition;Pain PT Treatment Interventions: Functional mobility training;Therapeutic activities;Therapeutic exercise;Patient/family education     PT Goals(Current goals can be found in the care plan section) Acute Rehab PT  Goals Patient Stated Goal: to see if she can move. Time For Goal Achievement: 05/16/13 Potential to Achieve Goals: Fair  Visit Information  Last PT Received On: 05/02/13 Assistance Needed: +2 History of Present Illness: Pt is an 79 year old female who sustained a R hip fx which was surgically managed with IM Nail and NWB.  She has a h/o dementia and she sustained a L hip fx in 3/13.         Prior Functioning  Home Living Family/patient expects to be discharged to:: Skilled nursing facility Prior Function Level of Independence: Needs assistance Comments: pt performed some adls and limited walking with walker Communication Communication:  (pt is very soft spoken and difficult to understand at times)    Cognition  Cognition Arousal/Alertness: Lethargic (responds and answers some questions with 1 word.`-) Behavior During Therapy:  (sleepy) Overall Cognitive Status: Impaired/Different from baseline    Extremity/Trunk Assessment Upper Extremity Assessment Upper Extremity Assessment: Generalized weakness (performed AAROM bil shoulders; grip 3/5; wrists are prominen) Lower Extremity Assessment Lower Extremity Assessment: RLE deficits/detail;LLE deficits/detail RLE Deficits / Details: pt did tolerate Hip flexion to about 80 egrees sitting on edge RLE: Unable to fully assess due to pain LLE Deficits / Details: did not really move the leg. LLE: Unable to fully assess due to pain Cervical / Trunk Assessment Cervical / Trunk Assessment: Kyphotic   Balance Balance Balance Assessed: Yes Static Sitting Balance Static Sitting - Balance Support: Right upper extremity supported;Left upper extremity supported;No upper extremity supported Static Sitting - Level of Assistance: 1: +2 Total assist;Patient percentage (comment) Static  Sitting - Comment/# of Minutes: 10 minutes., family gave pt 2 drinks while sitting up. Pt did cough and held fluids in mouth, gradually swallowed.  End of Session PT -  End of Session Activity Tolerance: Patient limited by lethargy;Other (comment) (by cognition) Patient left: in bed;with call bell/phone within reach;with family/visitor present Nurse Communication: Mobility status  GP     Rada Hay 05/02/2013, 5:42 PM Blanchard Kelch PT [phone]

## 2013-05-02 NOTE — Progress Notes (Signed)
OT Cancellation Note  Patient Details Name: Deborah Hunt MRN: [id number] DOB: 02/23/1927   Cancelled Treatment:    Reason Eval/Treat Not Completed: Other (comment).  Pt just started to eat.  Will return later if schedule permits or tomorrow.  [name] 05/02/2013, 3:18 PM [name], OTR/L [phone] 05/02/2013

## 2013-05-02 NOTE — Progress Notes (Signed)
[facility] PROGRESS NOTE  Deborah Hunt ZOX:[id number] DOB: 12-26-1926 DOA: 04/30/2013 PCP: Thora Lance, MD  Assessment/Plan: Right hip fracture - s/p mechanical fall - orthopedic surgery consulted by EDP, s/p OR 10/27 s/p IM R femoral nail - PT consult post op  - nutrition consult post op  Dementia - continue home medications  Parkinson disease - continue home medications  UTI - on Cipro - urine cultures with E coli, sensitivities pending.  ABLA - Hb 9.5 this morning, monitor. Obtain repeat CBC tonight.  Leukocytosis - minimal, likely reactive vs UTI. CXR normal  Diet: clear liwuid Fluids: NS DVT Prophylaxis: per ortho, ASA 325  Code Status: DNR Family Communication: none  Disposition Plan: pending PT evaluation  Consultants:  Orthopedic surgery  Procedures:  none   Antibiotics  Anti-infectives   Start     Dose/Rate Route Frequency Ordered Stop   05/02/13 0300  clindamycin (CLEOCIN) IVPB 600 mg     600 mg 100 mL/hr over 30 Minutes Intravenous Every 6 hours 05/01/13 2329 05/02/13 1459   05/01/13 2330  clindamycin (CLEOCIN) IVPB 600 mg  Status:  Discontinued     600 mg 100 mL/hr over 30 Minutes Intravenous On call to O.R. 05/01/13 2329 05/01/13 2335   05/01/13 2000  clindamycin (CLEOCIN) IVPB 600 mg     600 mg 100 mL/hr over 30 Minutes Intravenous On call to O.R. 05/01/13 1945 05/01/13 2125   05/01/13 0900  ciprofloxacin (CIPRO) IVPB 400 mg     400 mg 200 mL/hr over 60 Minutes Intravenous Every 12 hours 05/01/13 0826     05/01/13 0130  ciprofloxacin (CIPRO) tablet 500 mg  Status:  Discontinued     500 mg Oral 2 times daily 05/01/13 0111 05/01/13 0826     Antibiotics Given (last 72 hours)   Date/Time Action Medication Dose Rate   05/01/13 0335 Given   ciprofloxacin (CIPRO) tablet 500 mg 500 mg    05/01/13 0914 Given   ciprofloxacin (CIPRO) IVPB 400 mg 400 mg 200 mL/hr   05/01/13 2038 Given  [Per Surgeon, to give the scheduled 2100 dose pre-op.]    ciprofloxacin (CIPRO) IVPB 400 mg 400 mg    05/01/13 2125 Given   clindamycin (CLEOCIN) IVPB 600 mg 600 mg    05/02/13 0251 Given   clindamycin (CLEOCIN) IVPB 600 mg 600 mg 100 mL/hr     HPI/Subjective: - better this morning, brother states that she might have R facial droop  Objective: Filed Vitals:   05/02/13 0025 05/02/13 0125 05/02/13 0225 05/02/13 0524  BP: 153/66 148/74 148/75 144/78  Pulse: 84 88 92 88  Temp: 97.7 F (36.5 C) 97.7 F (36.5 C) 97.4 F (36.3 C) 98.3 F (36.8 C)  TempSrc: Axillary Axillary Axillary Axillary  Resp: 16 14 14 18   Height:      Weight:      SpO2: 100% 100% 100% 100%    Intake/Output Summary (Last 24 hours) at 05/02/13 0814 Last data filed at 05/02/13 0600  Gross per 24 hour  Intake 2623.33 ml  Output   1375 ml  Net 1248.33 ml   Filed Weights   05/01/13 0530  Weight: 41 kg (90 lb 6.2 oz)    Exam:   General:  NAD, more alert this morning, confused  Cardiovascular: regular rate and rhythm, without MRG  Respiratory: good air movement, clear to auscultation throughout, no wheezing, ronchi or rales  Abdomen: soft, not tender to palpation, positive bowel sounds  MSK: no peripheral edema  Neuro: grossly intact, face symmetric with good bilateral muscle activation, CN 2-12 grossly intact, MS 4/5 and symmetric bilateral upper extremities. No dysarthria.  Data Reviewed: Basic Metabolic Panel:  Recent Labs Lab 04/30/13 1200 05/02/13 0444  NA 143 135  K 3.5 3.0*  CL 104 102  CO2 27 24  GLUCOSE 88 92  BUN 11 8  CREATININE 0.60 0.45*  CALCIUM 9.8 8.2*   CBC:  Recent Labs Lab 04/30/13 1200 05/02/13 0444  WBC 10.6* 10.5  NEUTROABS 8.5*  --   HGB 14.4 9.5*  HCT 42.3 27.1*  MCV 95.3 93.4  PLT 235 139*    Recent Results (from the past 240 hour(s))  URINE CULTURE     Status: None   Collection Time    04/30/13  6:01 PM      Result Value Range Status   Specimen Description URINE, CATHETERIZED   Final   Special  Requests NONE   Final   Culture  Setup Time     Final   Value: 05/01/2013 11:05     Performed at [facility] Count     Final   Value: >=100,000 COLONIES/ML     Performed at [facility]   Culture     Final   Value: ESCHERICHIA COLI     Performed at [facility]   Report Status PENDING   Incomplete  MRSA PCR SCREENING     Status: None   Collection Time    05/01/13  3:33 AM      Result Value Range Status   MRSA by PCR NEGATIVE  NEGATIVE Final   Comment:            The GeneXpert MRSA Assay (FDA     approved for NASAL specimens     only), is one component of a     comprehensive MRSA colonization     surveillance program. It is not     intended to diagnose MRSA     infection nor to guide or     monitor treatment for     MRSA infections.     Studies: Dg Chest 1 View  04/30/2013   CLINICAL DATA:  Larey Seat today, hip pain  EXAM: CHEST - 1 VIEW  COMPARISON:  09/13/2012  FINDINGS: Heart size within normal limits. No evidence of consolidation or effusion. Bony thorax is intact.  IMPRESSION: No active disease.   Electronically Signed   By: Esperanza Heir M.D.   On: 04/30/2013 11:37   Dg Hip Bilateral W/pelvis  04/30/2013   CLINICAL DATA:  Fall with bilateral hip pain. History prior left hip fracture with pinning.  EXAM: BILATERAL HIP WITH PELVIS - 4+ VIEW  COMPARISON:  09/14/2012  FINDINGS: Acute intertrochanteric fracture of the right hip demonstrates displacement. No evidence of dislocation. Screws across the left femoral neck shows stable positioning and there is no evidence of acute left hip fracture. The left femoral neck is very foreshortened on the radiographs which is felt to primarily relate to leg rotation. If there is any further remaining suspicion of potential acute injury to left hip, repeat dedicated films would be recommended in a better position. The bony pelvis is intact.  IMPRESSION: 1. Acute intertrochanteric fracture of the right hip. 2. No  acute fracture is identified on the left with previously placed screws demonstrating stable positioning. The left femoral neck is considerably foreshortened on the images due to rotation. If there remains any significant left hip pain, recommend  repeat hip radiographs.   Electronically Signed   By: Irish Lack M.D.   On: 04/30/2013 11:50   Dg Hip Operative Right  05/02/2013   CLINICAL DATA:  Intertrochanteric fracture  EXAM: DG OPERATIVE RIGHT HIP  COMPARISON:  04/30/2013  FINDINGS: Three intraoperative fluoroscopic spot images document placement of a IM rod in the proximal right femur with a sliding screw across the femoral neck and a single distal interlocking screw. Fragments in near anatomic alignment.  IMPRESSION: Internal fixation of intertrochanteric fracture.   Electronically Signed   By: Oley Balm M.D.   On: 05/02/2013 01:13    Scheduled Meds: . aspirin EC  325 mg Oral Q breakfast  . carbidopa-levodopa  1.5 tablet Oral TID WC   And  . carbidopa-levodopa  1 tablet Oral Q24H  . ciprofloxacin  400 mg Intravenous Q12H  . clindamycin (CLEOCIN) IV  600 mg Intravenous Q6H  . docusate sodium  100 mg Oral BID  . donepezil  10 mg Oral QHS  . fentaNYL      . potassium chloride  40 mEq Oral BID WC   Continuous Infusions: . sodium chloride 125 mL/hr at 05/01/13 0335  . sodium chloride 50 mL/hr at 05/02/13 0047    Principal Problem:   Hip fracture, right Active Problems:   Parkinson disease   Osteoporosis, unspecified   Depression due to dementia   DNR (do not resuscitate)  Time spent: 25  Pamella Pert, MD [facility] Pager [phone]. If 7 PM - 7 AM, please contact night-coverage at [email or web address], password [facility] 05/02/2013, 8:14 AM  LOS: 2 days

## 2013-05-02 NOTE — Progress Notes (Signed)
CSW assisting with d/c planning. Pt will return to [facility] when stable for d/c. CSW will continue to follow to assist with d/c planning to SNF.  Cori Razor LCSW [phone]

## 2013-05-02 NOTE — Progress Notes (Signed)
   Subjective: 1 Day Post-Op Procedure(s) (LRB): INTRAMEDULLARY (IM) NAIL FEMORAL (Right)   Patient reports pain as mild, pain appears controlled. Resting comfortably  Objective:   VITALS:   Filed Vitals:   05/02/13 0524  BP: 144/78  Pulse: 88  Temp: 98.3 F (36.8 C)  Resp: 18    Dorsiflexion/Plantar flexion intact Incision: dressing C/D/I No cellulitis present Compartment soft  LABS  Recent Labs  04/30/13 1200 05/02/13 0444  HGB 14.4 9.5*  HCT 42.3 27.1*  WBC 10.6* 10.5  PLT 235 139*     Recent Labs  04/30/13 1200 05/02/13 0444  NA 143 135  K 3.5 3.0*  BUN 11 8  CREATININE 0.60 0.45*  GLUCOSE 88 92     Assessment/Plan: 1 Day Post-Op Procedure(s) (LRB): INTRAMEDULLARY (IM) NAIL FEMORAL (Right)   Up with therapy Discharge to SNF eventually when ready   Anastasio Auerbach. Nikiya Starn   PAC  05/02/2013, 8:16 AM

## 2013-05-02 NOTE — Op Note (Signed)
NAMEGAIL, Deborah Hunt NO.:  [id number]  MEDICAL RECORD NO.:  [id number]  LOCATION:  1612                         FACILITY:  [facility]  PHYSICIAN:  Madlyn Frankel. Charlann Boxer, M.D.  DATE OF BIRTH:  Oct 13, 1926  DATE OF PROCEDURE:  05/01/2013 DATE OF DISCHARGE:                              OPERATIVE REPORT   PREOPERATIVE DIAGNOSIS:  Comminuted right intertrochanteric femur fracture.  POSTOPERATIVE DIAGNOSIS:  Comminuted right intertrochanteric femur fracture.  PROCEDURE:  Open reduction and internal fixation of right intertrochanteric femur fracture utilizing Biomet Affixus nail 9 x 180 mm with 130-degree lag screw measured 90 mm and a distal interlock.  SURGEON:  Madlyn Frankel. Charlann Boxer, M.D.  ASSISTANT:  Surgical team.  ANESTHESIA:  Spinal.  SPECIMENS:  None.  COMPLICATIONS:  None.  DRAINS:  None.  BLOOD LOSS:  Less than 50 mL.  INDICATION FOR PROCEDURE:  Ms. Zahniser is an 79 year old female, multiple medical comorbidities include Parkinson's and dementia, who unfortunately had a fall at her place of residence, [facility]. She was brought to the emergency room due to inability to bear weight. Radiographs revealed a comminuted intertrochanteric femur fracture.  She was admitted to the Medical Service.  Orthopedics was consulted for definitive management.  The patient had a previous left femoral neck fracture, treated with percutaneous cancellous screws, it healed albeit with some collapse.  Risks, benefits, and necessity of procedure were discussed with the family.  Consent was obtained for benefit of pain relief and mobility purposes.  DESCRIPTION OF PROCEDURE:  The patient was brought to the operative theater.  Once adequate anesthesia, preoperative antibiotics, clindamycin administered in addition to the previously utilized ciprofloxacin for urinary tract infection, she was positioned supine on the fracture table.  Her left leg, the unaffected leg, currently  was positioned into the abduction leg holder with bony prominences padded particularly over the peroneal nerve.  The right foot was placed in the traction boot.  Traction internal rotation applied.  Radiographic fluoroscopy was used to confirm reduction of the fracture.  Once this was done, the right hip was prepped and draped in the iliac crest to the knee.  Time-out was performed identifying the patient, planned procedure, and extremity.  Fluoroscopy was brought back to the field. An incision was made in the proximal trochanter.  The guidewire was inserted into the tip of the trochanter posteriorly and then passed into the proximal femur.  Once confirmed radiographically, I drilled open the proximal femur, then passed by hand an intramedullary nail 9 x 180 mm. It was passed by hand to appropriate depth using the guide.  I then inserted a guidewire into the center of the femoral head in AP and lateral planes and drilled, selected a 90 mm lag screw.  The lag screw was then placed within 5 mm of the subchondral bone region or within the subchondral bone.  I then locked this down based on the nature of her bone quality.  The distal interlock was placed measuring 34 mm.  The jig was removed. Final radiographs were obtained in AP and lateral planes.  The wounds were irrigated with normal saline solution.  Her gluteal fascia was reapproximated using #1 Vicryl.  All remaining wounds were closed with 2- 0 Vicryl and staples on the skin.  The skin was cleaned, dried, and dressed sterilely using Mepilex dressing.  She was then brought to recovery room in stable condition, tolerated the procedure well. Findings were reviewed with her husband and brother.  Based on the nature of bone and her dementia, I am going to have her to be nonweightbearing, I feel I can protect this and give [place] the best chance for this to heal.  I did review with family the potential risk of hardware failure, failure of her  osteoporotic bone across her screw.     Madlyn Frankel Charlann Boxer, M.D.     MDO/MEDQ  D:  05/01/2013  T:  05/02/2013  Job:  [id number]

## 2013-05-03 LAB — CBC
MCH: 32.8 pg (ref 26.0–34.0)
MCHC: 35.1 g/dL (ref 30.0–36.0)
MCV: 93.4 fL (ref 78.0–100.0)
Platelets: 147 10*3/uL — ABNORMAL LOW (ref 150–400)
RDW: 12.4 % (ref 11.5–15.5)

## 2013-05-03 LAB — BASIC METABOLIC PANEL
CO2: 24 mEq/L (ref 19–32)
Calcium: 8.1 mg/dL — ABNORMAL LOW (ref 8.4–10.5)
Creatinine, Ser: 0.52 mg/dL (ref 0.50–1.10)
GFR calc Af Amer: >90 mL/min (ref >90)
Potassium: 3.7 mEq/L (ref 3.5–5.1)

## 2013-05-03 LAB — URINE CULTURE

## 2013-05-03 MED ORDER — HYDROCODONE-ACETAMINOPHEN 5-325 MG PO TABS: 1.0000 | ORAL_TABLET | Freq: Four times a day (QID) | ORAL | Status: DC | PRN

## 2013-05-03 MED ORDER — ASPIRIN 325 MG PO TBEC: 325.0000 mg | DELAYED_RELEASE_TABLET | Freq: Two times a day (BID) | ORAL | Status: AC

## 2013-05-03 MED ORDER — CIPROFLOXACIN HCL 500 MG PO TABS: 500.0000 mg | ORAL_TABLET | Freq: Two times a day (BID) | ORAL | Status: DC

## 2013-05-03 MED ORDER — BOOST PLUS PO LIQD
237.0000 mL | ORAL | Status: DC
Start: 2013-05-03 — End: 2013-05-03
  Filled 2013-05-03: qty 237

## 2013-05-03 MED ORDER — ASPIRIN EC 325 MG PO TBEC
325.0000 mg | DELAYED_RELEASE_TABLET | Freq: Two times a day (BID) | ORAL | Status: DC
  Filled 2013-05-03 (×2): qty 1

## 2013-05-03 MED ORDER — BOOST / RESOURCE BREEZE PO LIQD
1.0000 | ORAL | Status: DC
  Administered 2013-05-03: 1 via ORAL

## 2013-05-03 MED ORDER — ENSURE PUDDING PO PUDG
1.0000 | ORAL | Status: DC
  Filled 2013-05-03: qty 1

## 2013-05-03 NOTE — Progress Notes (Signed)
Report called to Jola Babinski, at [facility], who stated understanding and denied questions/concerns; awaiting [facility] transport at this time; stable at time of transfer; family at bedside and made aware

## 2013-05-03 NOTE — Progress Notes (Signed)
Discharge summary sent to payer through [facility]  

## 2013-05-03 NOTE — Discharge Summary (Signed)
Physician Discharge Summary  Deborah Hunt WUJ:[id number] DOB: June 15, 1927 DOA: 04/30/2013  PCP: Thora Lance, MD  Admit date: 04/30/2013 Discharge date: 05/03/2013  Time spent: 40 minutes  Recommendations for Outpatient Follow-up:  1. Will need complete blood count in about a week 2. Recommend reorientation methods inclusive of sunlight, sleep wake full cycle  3. She is to be only touchdown weight bearing on her right side  Discharge Diagnoses:  Principal Problem:   Hip fracture, right Active Problems:   Parkinson disease   Osteoporosis, unspecified   Depression due to dementia   DNR (do not resuscitate)   Discharge Condition: Stable  Diet recommendation: Regular  Filed Weights   05/01/13 0530  Weight: 41 kg (90 lb 6.2 oz)    History of present illness:  79 year old female, known history dementia, recent left fracture hip 09/2011 admitted 04/30/2013 with fall on right side and had.-No reported syncope See below 40 to  Hospital Course:  Right hip fracture - s/p mechanical fall  - orthopedic surgery consulted by EDP, s/p OR 10/27 s/p IM R femoral nail  - PT consult post op-recommended skilled placement. - nutrition consult post op  Dementia - continue home medications-this is likely moderate to severe Parkinson disease/Louise body disease - continue home medications  UTI - on Cipro  - urine cultures with E coli-to complete antibiotics on 05/06/13 Acute blood loss anemia, most likely also hemoconcentrated-hemoglobin 8.4 on discharge, needs repeat in about one week Leukocytosis - minimal, likely reactive vs UTI. CXR normal Undifferentiated thrombocytopenia-mild, repeat blood count in a week   Consultations:  Orthopedics, Dr Ferd Hibbs  Discharge Exam: Filed Vitals:   05/03/13 0800  BP:   Pulse:   Temp:   Resp: 18    General: Slightly sleepy Cardiovascular: S1-S2 no murmur rub or gallop Respiratory: Clear  Discharge Instructions  Discharge Orders    Future Appointments Provider Department Dept Phone   02/14/2014 11:00 AM Ronal Fear, NP [facility] [phone]   Future Orders Complete By Expires   Call MD / Call 911  As directed    Comments:     If you experience chest pain or shortness of breath, CALL 911 and be transported to the hospital emergency room.  If you develope a fever above 101 F, pus (white drainage) or increased drainage or redness at the wound, or calf pain, call your surgeon's office.   Constipation Prevention  As directed    Comments:     Drink plenty of fluids.  Prune juice may be helpful.  You may use a stool softener, such as Colace (over the counter) 100 mg twice a day.  Use MiraLax (over the counter) for constipation as needed.   Diet - low sodium heart healthy  As directed    Discharge instructions  As directed    Comments:     Daily dressing changes with gauze and tape. Keep the area dry and clean until follow up. Follow up in 2 weeks at [facility]. Call with any questions or concerns.   Non weight bearing  As directed    Questions:     Laterality:  right   Extremity:  Lower   Touch down weight bearing  As directed    Scheduling Instructions:     25%   Comments:     Intended to ease transfers only\ Otherwise in her best interest to remain non-weight bearing on this right leg   Questions:     Laterality:  right  Extremity:         Medication List    STOP taking these medications       loperamide 2 MG tablet  Commonly known as:  IMODIUM A-D     meloxicam 15 MG tablet  Commonly known as:  MOBIC      TAKE these medications       acetaminophen 500 MG tablet  Commonly known as:  TYLENOL  Take 500 mg by mouth 3 (three) times daily.     ARICEPT 10 MG tablet  Generic drug:  donepezil  Take 10 mg by mouth at bedtime. Take 1 tablet once daily at bedtime.     aspirin 325 MG EC tablet  Take 1 tablet (325 mg total) by mouth 2 (two) times daily.     bisacodyl 10 MG  suppository  Commonly known as:  DULCOLAX  Place 1 suppository (10 mg total) rectally daily as needed.     carbidopa-levodopa 25-100 MG per tablet  Commonly known as:  SINEMET IR  Take 0.5-1.5 tablets by mouth 3 (three) times daily. Pt takes 1.5 tablets with meals. At 3p pt's caregiver gives her 1/2 tab -1 tablet depending on severity of tremors/Parkinson disease.     ciprofloxacin 500 MG tablet  Commonly known as:  CIPRO  Take 1 tablet (500 mg total) by mouth 2 (two) times daily.     feeding supplement (RESOURCE BREEZE) Liqd  Take 1 Container by mouth 2 (two) times daily.     BOOST PO  Take 1 Can by mouth 2 (two) times daily.     HYDROcodone-acetaminophen 5-325 MG per tablet  Commonly known as:  NORCO/VICODIN  Take 1-2 tablets by mouth every 6 (six) hours as needed.     polyethylene glycol powder powder  Commonly known as:  GLYCOLAX/MIRALAX  Take 17 g by mouth as needed (constipation).     traMADol-acetaminophen 37.5-325 MG per tablet  Commonly known as:  ULTRACET  Take 1 tablet by mouth every 6 (six) hours as needed for pain.     Vitamin D (Ergocalciferol) 50000 UNITS Caps capsule  Commonly known as:  DRISDOL  Take 50,000 Units by mouth every 7 (seven) days. Monday- Take 1 by mouth weekly for vitamin deficiency.       Allergies  Allergen Reactions  . Lidocaine   . Other Other (See Comments)    Novocaine  . Penicillins   . Sulfa Antibiotics        Follow-up Information   Follow up with Shelda Pal, MD. Schedule an appointment as soon as possible for a visit in 2 weeks.   Specialty:  Orthopedic Surgery   Contact information:   [address] [place] [address] [phone]        The results of significant diagnostics from this hospitalization (including imaging, microbiology, ancillary and laboratory) are listed below for reference.    Significant Diagnostic Studies: Dg Chest 1 View  04/30/2013   CLINICAL DATA:  Larey Seat today, hip  pain  EXAM: CHEST - 1 VIEW  COMPARISON:  09/13/2012  FINDINGS: Heart size within normal limits. No evidence of consolidation or effusion. Bony thorax is intact.  IMPRESSION: No active disease.   Electronically Signed   By: Esperanza Heir M.D.   On: 04/30/2013 11:37   Dg Hip Bilateral W/pelvis  04/30/2013   CLINICAL DATA:  Fall with bilateral hip pain. History prior left hip fracture with pinning.  EXAM: BILATERAL HIP WITH PELVIS - 4+ VIEW  COMPARISON:  09/14/2012  FINDINGS: Acute intertrochanteric fracture of the right hip demonstrates displacement. No evidence of dislocation. Screws across the left femoral neck shows stable positioning and there is no evidence of acute left hip fracture. The left femoral neck is very foreshortened on the radiographs which is felt to primarily relate to leg rotation. If there is any further remaining suspicion of potential acute injury to left hip, repeat dedicated films would be recommended in a better position. The bony pelvis is intact.  IMPRESSION: 1. Acute intertrochanteric fracture of the right hip. 2. No acute fracture is identified on the left with previously placed screws demonstrating stable positioning. The left femoral neck is considerably foreshortened on the images due to rotation. If there remains any significant left hip pain, recommend repeat hip radiographs.   Electronically Signed   By: Irish Lack M.D.   On: 04/30/2013 11:50   Dg Hip Operative Right  05/02/2013   CLINICAL DATA:  Intertrochanteric fracture  EXAM: DG OPERATIVE RIGHT HIP  COMPARISON:  04/30/2013  FINDINGS: Three intraoperative fluoroscopic spot images document placement of a IM rod in the proximal right femur with a sliding screw across the femoral neck and a single distal interlocking screw. Fragments in near anatomic alignment.  IMPRESSION: Internal fixation of intertrochanteric fracture.   Electronically Signed   By: Oley Balm M.D.   On: 05/02/2013 01:13     Microbiology: Recent Results (from the past 240 hour(s))  URINE CULTURE     Status: None   Collection Time    04/30/13  6:01 PM      Result Value Range Status   Specimen Description URINE, CATHETERIZED   Final   Special Requests NONE   Final   Culture  Setup Time     Final   Value: 05/01/2013 11:05     Performed at [facility] Count     Final   Value: >=100,000 COLONIES/ML     Performed at [facility]   Culture     Final   Value: ESCHERICHIA COLI     Performed at [facility]   Report Status 05/03/2013 FINAL   Final   Organism ID, Bacteria ESCHERICHIA COLI   Final  MRSA PCR SCREENING     Status: None   Collection Time    05/01/13  3:33 AM      Result Value Range Status   MRSA by PCR NEGATIVE  NEGATIVE Final   Comment:            The GeneXpert MRSA Assay (FDA     approved for NASAL specimens     only), is one component of a     comprehensive MRSA colonization     surveillance program. It is not     intended to diagnose MRSA     infection nor to guide or     monitor treatment for     MRSA infections.     Labs: Basic Metabolic Panel:  Recent Labs Lab 04/30/13 1200 05/02/13 0444 05/03/13 0503  NA 143 135 134*  K 3.5 3.0* 3.7  CL 104 102 105  CO2 27 24 24   GLUCOSE 88 92 104*  BUN 11 8 9   CREATININE 0.60 0.45* 0.52  CALCIUM 9.8 8.2* 8.1*   Liver Function Tests: No results found for this basename: AST, ALT, ALKPHOS, BILITOT, PROT, ALBUMIN,  in the last 168 hours No results found for this basename: LIPASE, AMYLASE,  in the last 168 hours No  results found for this basename: AMMONIA,  in the last 168 hours CBC:  Recent Labs Lab 04/30/13 1200 05/02/13 0444 05/02/13 1758 05/03/13 0503  WBC 10.6* 10.5 12.4* 11.1*  NEUTROABS 8.5*  --   --   --   HGB 14.4 9.5* 8.5* 8.4*  HCT 42.3 27.1* 25.1* 23.9*  MCV 95.3 93.4 93.3 93.4  PLT 235 139* 140* 147*   Cardiac Enzymes: No results found for this basename: CKTOTAL, CKMB,  CKMBINDEX, TROPONINI,  in the last 168 hours BNP: BNP (last 3 results) No results found for this basename: PROBNP,  in the last 8760 hours CBG: No results found for this basename: GLUCAP,  in the last 168 hours     Signed:  Rhetta Mura  [facility] 05/03/2013, 10:55 AM

## 2013-05-03 NOTE — Progress Notes (Signed)
   Subjective: 2 Days Post-Op Procedure(s) (LRB): INTRAMEDULLARY (IM) NAIL FEMORAL (Right)   Patient resting comfortably. No events throughout the night. Orthopaedically stable.  Objective:   VITALS:   Filed Vitals:   05/03/13  BP: 117/67  Pulse: 82  Temp: 99.3 F (37.4 C)   Resp: 18    Incision: dressing C/D/I No cellulitis present Compartment soft  LABS  Recent Labs  05/02/13 0444 05/02/13 1758 05/03/13 0503  HGB 9.5* 8.5* 8.4*  HCT 27.1* 25.1* 23.9*  WBC 10.5 12.4* 11.1*  PLT 139* 140* 147*     Recent Labs  04/30/13 1200 05/02/13 0444 05/03/13 0503  NA 143 135 134*  K 3.5 3.0* 3.7  BUN 11 8 9   CREATININE 0.60 0.45* 0.52  GLUCOSE 88 92 104*     Assessment/Plan: 2 Days Post-Op Procedure(s) (LRB): INTRAMEDULLARY (IM) NAIL FEMORAL (Right)  Up with therapy Discharge when ready medically Orthopaedically stable NWB right leg, may TDWB for transfers, otherwise NWB. ASA 325 mg bid for 4 weeks. Hydrocodone for pain Colace and miralax as need for constipation Follow up in 2 weeks at [facility]. Follow up with OLIN,Rubina Basinski D in 2 weeks.  Contact information:  [facility] [address], [address] [place] [address] [phone]        Anastasio Auerbach. Eryanna Regal   PAC  05/03/2013, 10:14 AM

## 2013-05-04 ENCOUNTER — Encounter: Payer: Self-pay | Admitting: Nurse Practitioner

## 2013-05-04 ENCOUNTER — Non-Acute Institutional Stay (SKILLED_NURSING_FACILITY): Payer: Medicare Other | Admitting: Internal Medicine

## 2013-05-04 ENCOUNTER — Encounter: Payer: Self-pay | Admitting: Internal Medicine

## 2013-05-04 DIAGNOSIS — D62 Acute posthemorrhagic anemia: Secondary | ICD-10-CM | POA: Insufficient documentation

## 2013-05-04 DIAGNOSIS — S72009A Fracture of unspecified part of neck of unspecified femur, initial encounter for closed fracture: Secondary | ICD-10-CM

## 2013-05-04 DIAGNOSIS — R7309 Other abnormal glucose: Secondary | ICD-10-CM

## 2013-05-04 DIAGNOSIS — S72001A Fracture of unspecified part of neck of right femur, initial encounter for closed fracture: Secondary | ICD-10-CM

## 2013-05-04 DIAGNOSIS — G20 Parkinson's disease: Secondary | ICD-10-CM

## 2013-05-04 DIAGNOSIS — R739 Hyperglycemia, unspecified: Secondary | ICD-10-CM

## 2013-05-04 DIAGNOSIS — N390 Urinary tract infection, site not specified: Secondary | ICD-10-CM

## 2013-05-04 DIAGNOSIS — F0280 Dementia in other diseases classified elsewhere without behavioral disturbance: Secondary | ICD-10-CM

## 2013-05-04 NOTE — Progress Notes (Signed)
This encounter was created in error - please disregard.

## 2013-05-04 NOTE — Progress Notes (Signed)
Subjective:    Patient ID: Deborah Hunt, female    DOB: 1926-09-16, 79 y.o.   MRN: [id number]  Chief Complaint  Patient presents with  . Hospitalization Follow-up    Neew admit to SNF following hospitalization    HPI Patient was admitted to skilled nursing facility 09/16/12. She was hospitalized 09/13/12 through 09/16/12. She had a fall which resulted in a left hip fracture. This was a left subcapital fracture of the femur. It was repaired with a cannulated pin by Dr. Ollen Gross. Hospital postoperative course was relatively unremarkable. She has a number of associated medical problems which are reviewed below.  Hip fracture, left, closed, initial encounter: Patient is admitted to skilled nursing facility for rehabilitative efforts. Her dementia is significant enough that she really cannot care for himself at home. This is likely to be a long-term care remission following rehabilitation.  Dementia with Lewy bodies: Significant memory loss. Review of past history does not show any serious behavioral problems.  Parkinson disease: Followed by neurologist currently stable. There is rigidity, but little tremor.  Depression due to dementia: Previously treated with medications  Osteoarthritis of left hip: known history of arthritis of both hips.  Osteoporosis, unspecified: Generalized fragility of bones.  ASCVD (arteriosclerotic cardiovascular disease): Historical diagnoses. No chest pains.  Unspecified vitamin D deficiency: Compensated with supplements.  Cardiomegaly: Incidental finding on x-rays  Osteoarthritis: Generalized, but worse in the hips.   Past Medical History  Diagnosis Date  . Senile dementia with depressive features 09/25/2012  . GERD (gastroesophageal reflux disease)   . Diverticulosis   . Arthritis   . Osteoporosis   . Complication of anesthesia     caused brain disease  . Paralysis agitans 09/25/2012  . Unspecified constipation 09/25/2012  . Pain in joint,  pelvic region and thigh 09/25/2012.  Marland Kitchen Senile osteoporosis 09/25/2012  . Other orthopedic aftercare(V54.89) 09/25/2012  . Fracture of femoral neck, left, closed, impacted March 2014    Dr. Lequita Halt  . Intertrochanteric fracture of right femur 04/30/13    Dr. Luna Fuse  . Lewy body dementia 2013  . Urinary tract infection, site not specified 2014  . Unspecified constipation 2013   Past Surgical History  Procedure Laterality Date  . Tubal ligation    . Nipple removal      from left breast due to Paget's disease  . Abdominal hysterectomy    . Eye surgery      bilateral cataract surgery  . Hip pinning,cannulated Left 09/14/2012    Procedure: CANNULATED HIP PINNING;  Surgeon: [name], MD;  Location: [facility];  Service: Orthopedics;  Laterality: Left;  . Femur im nail Right 05/01/2013    Procedure: INTRAMEDULLARY (IM) NAIL FEMORAL;  Surgeon: Shelda Pal, MD;  Location: [facility];  Service: Orthopedics;  Laterality: Right;    Current Outpatient Prescriptions on File Prior to Visit  Medication Sig Dispense Refill  . bisacodyl (DULCOLAX) 10 MG suppository Place 1 suppository (10 mg total) rectally daily as needed.  12 suppository  0  . carbidopa-levodopa (SINEMET IR) 25-100 MG per tablet Take 0.5-1.5 tablets by mouth 3 (three) times daily. Pt takes 1.5 tablets with meals. At 3p pt's caregiver gives her 1/2 tab -1 tablet depending on severity of tremors/Parkinson disease.      . donepezil (ARICEPT) 10 MG tablet Take 10 mg by mouth at bedtime. Take 1 tablet once daily at bedtime.      . Vitamin D, Ergocalciferol, (DRISDOL) 50000 UNITS CAPS Take 50,000 Units  by mouth every 7 (seven) days. Monday- Take 1 by mouth weekly for vitamin deficiency.       Current Facility-Administered Medications on File Prior to Visit  Medication Dose Route Frequency Provider Last Rate Last Dose  . ceFAZolin (ANCEF) IVPB 2 g/50 mL premix   Intravenous PRN Angela Adam, CRNA   2 g at 09/14/12 1610    Allergies   Allergen Reactions  . Lidocaine   . Other Other (See Comments)    Novocaine  . Penicillins   . Sulfa Antibiotics    Family Status  Relation Status Death Age  . Mother Deceased 70  . Father Deceased 33   Family History  Problem Relation Age of Onset  . Cancer Sister   . Cancer Mother   . Heart attack Father    History   Social History  . Marital Status: Married    Spouse Name: N/A    Number of Children: 2  . Years of Education: Automotive engineer   Social History Main Topics  . Smoking status: Former Smoker -- 0.50 packs/day for 30 years    Quit date: 09/13/1992  . Smokeless tobacco: Never Used  . Alcohol Use: No     Comment: occasionally  . Drug Use: No  . Sexual Activity: Not on file   Other Topics Concern  . Not on file   Social History Narrative   Lives at home with spouse.   Caffeine Use: 1.5 cups of coffee daily     Review of Systems  Constitutional: Positive for activity change and fatigue. Negative for fever, chills, diaphoresis, appetite change and unexpected weight change.  HENT: Negative for congestion, dental problem, drooling, hearing loss, mouth sores and sore throat.   Eyes: Positive for visual disturbance (corrective lenses).  Respiratory: Negative for cough, choking, chest tightness and shortness of breath.   Cardiovascular: Negative for chest pain, palpitations and leg swelling.  Gastrointestinal: Negative.        Diverticulosis  Endocrine: Negative.   Genitourinary:       Incontinence of urine  Musculoskeletal:       Recent left fracture repaired by Dr. Lequita Halt with pins.  Skin: Negative.   Neurological: Negative for tremors, light-headedness, numbness and headaches.       Significant dementia. History of Lewy body disease.  Hematological: Negative for adenopathy. Does not bruise/bleed easily.  Psychiatric/Behavioral: Positive for confusion. Negative for behavioral problems and agitation.       Objective:BP 128/76  Pulse 80  Temp(Src) 98.6  F (37 C)  Resp 20  Ht 4\' 10"  (1.473 m)  Wt 88 lb (39.917 kg)  BMI 18.4 kg/m2    Physical Exam  Constitutional: No distress.  Frail, elderly female.  HENT:  Head: Normocephalic and atraumatic.  Right Ear: External ear normal.  Left Ear: External ear normal.  Nose: Nose normal.  Mouth/Throat: Oropharynx is clear and moist.  Eyes: Conjunctivae and EOM are normal. Pupils are equal, round, and reactive to light.  Neck: No JVD present. No tracheal deviation present. No thyromegaly present.  Cardiovascular: Normal rate, regular rhythm, normal heart sounds and intact distal pulses.  Exam reveals no gallop and no friction rub.   No murmur heard. Pulmonary/Chest: No respiratory distress. She has no wheezes. She has no rales. She exhibits no tenderness.  Abdominal: She exhibits no distension and no mass. There is no tenderness.  Scaphoid abdomen.  Musculoskeletal: She exhibits no edema.  Fresh incision at the left hip. Some swelling and discomfort. Bruising.  Lymphadenopathy:    She has no cervical adenopathy.  Neurological: She is alert. No cranial nerve deficit. Coordination normal.  Demented. Responds to some questions.  Skin: No rash noted. No erythema. No pallor.  Psychiatric: She has a normal mood and affect. Her behavior is normal.          Assessment & Plan:  Hip fracture, left, closed, initial encounter: Rehabilitation with physical therapy and occupational therapy has been initiated. Wound care is done daily. Wound is currently without signs of infection.  Dementia with Lewy bodies: Continue to monitor  Parkinson disease: Continue current medications  Depression due to dementia: Stable and not currently medicated  Osteoarthritis of left hip: Continue pain relief as needed  Osteoporosis, unspecified: Continue vitamin D  ASCVD (arteriosclerotic cardiovascular disease): Observe  Unspecified vitamin D deficiency: Continue vitamin D  Cardiomegaly:  Observe  Osteoarthritis: Medicate for pain as needed.

## 2013-05-05 ENCOUNTER — Encounter: Payer: Self-pay | Admitting: Internal Medicine

## 2013-05-05 NOTE — Progress Notes (Signed)
Subjective:    Patient ID: Deborah Hunt, female    DOB: Oct 31, 1926, 79 y.o.   MRN: [id number]  Chief Complaint  Patient presents with  . Hospitalization Follow-up    Readmission to skilled nursing facility following hospitalization    HPI Readmitted 05/03/13 to skilled nursing facility following hospitalization. Hospitalized 04/30/13 through 05/03/13 for a fall resulting in the right hip fracture. The fracture was an acute right intertrochanteric fracture that was repaired by Dr. Durene Romans with an intramedullary nail.  Hospitalized she was noted to have a urinary tract infection with Escherichia coli and was subsequently treated with antibiotics and continued on at the nursing home.  There was an acute blood loss postoperative anemia and hyperglycemia noted on her lab work.  Past Medical History  Diagnosis Date  . Senile dementia with depressive features 09/25/2012  . GERD (gastroesophageal reflux disease)   . Diverticulosis   . Arthritis   . Osteoporosis   . Complication of anesthesia     caused brain disease  . Paralysis agitans 09/25/2012  . Unspecified constipation 09/25/2012  . Pain in joint, pelvic region and thigh 09/25/2012.  Marland Kitchen Senile osteoporosis 09/25/2012  . Other orthopedic aftercare(V54.89) 09/25/2012  . Fracture of femoral neck, left, closed, impacted March 2014    Dr. Lequita Halt  . Intertrochanteric fracture of right femur 04/30/13    Dr. Luna Fuse  . Lewy body dementia 2013  . Urinary tract infection, site not specified 2014  . Unspecified constipation 2013   Allergies  Allergen Reactions  . Lidocaine   . Other Other (See Comments)    Novocaine  . Penicillins   . Sulfa Antibiotics    Current Outpatient Prescriptions on File Prior to Visit  Medication Sig Dispense Refill  . acetaminophen (TYLENOL) 500 MG tablet Take 500 mg by mouth 3 (three) times daily.      Marland Kitchen aspirin EC 325 MG EC tablet Take 1 tablet (325 mg total) by mouth 2 (two) times daily.  60  tablet  0  . bisacodyl (DULCOLAX) 10 MG suppository Place 1 suppository (10 mg total) rectally daily as needed.  12 suppository  0  . carbidopa-levodopa (SINEMET IR) 25-100 MG per tablet Take 0.5-1.5 tablets by mouth 3 (three) times daily. Pt takes 1.5 tablets with meals. At 3p pt's caregiver gives her 1/2 tab -1 tablet depending on severity of tremors/Parkinson disease.      . ciprofloxacin (CIPRO) 500 MG tablet Take 1 tablet (500 mg total) by mouth 2 (two) times daily.  6 tablet  0  . donepezil (ARICEPT) 10 MG tablet Take 10 mg by mouth at bedtime. Take 1 tablet once daily at bedtime.      . feeding supplement (RESOURCE BREEZE) LIQD Take 1 Container by mouth 2 (two) times daily.      . ferrous sulfate 325 (65 FE) MG tablet Take 325 mg by mouth 2 (two) times daily.      Marland Kitchen HYDROcodone-acetaminophen (NORCO/VICODIN) 5-325 MG per tablet Take 1-2 tablets by mouth every 6 (six) hours as needed.  50 tablet  0  . memantine (NAMENDA) 10 MG tablet Take 10 mg by mouth 2 (two) times daily.      . Nutritional Supplements (BOOST PO) Take 1 Can by mouth 2 (two) times daily.      . polyethylene glycol powder (GLYCOLAX/MIRALAX) powder Take 17 g by mouth as needed (constipation).       . traMADol-acetaminophen (ULTRACET) 37.5-325 MG per tablet Take 1 tablet by mouth every  6 (six) hours as needed for pain.  60 tablet  0  . Vitamin D, Ergocalciferol, (DRISDOL) 50000 UNITS CAPS Take 50,000 Units by mouth every 7 (seven) days. Monday- Take 1 by mouth weekly for vitamin deficiency.       Current Facility-Administered Medications on File Prior to Visit  Medication Dose Route Frequency Provider Last Rate Last Dose  . ceFAZolin (ANCEF) IVPB 2 g/50 mL premix   Intravenous PRN Angela Adam, CRNA   2 g at 09/14/12 1610    Past Surgical History  Procedure Laterality Date  . Tubal ligation    . Nipple removal      from left breast due to Paget's disease  . Abdominal hysterectomy    . Eye surgery      bilateral  cataract surgery  . Hip pinning,cannulated Left 09/14/2012    Procedure: CANNULATED HIP PINNING;  Surgeon: [name], MD;  Location: [facility];  Service: Orthopedics;  Laterality: Left;  . Femur im nail Right 05/01/2013    Procedure: INTRAMEDULLARY (IM) NAIL FEMORAL;  Surgeon: Shelda Pal, MD;  Location: [facility];  Service: Orthopedics;  Laterality: Right;   Family Status  Relation Status Death Age  . Mother Deceased 62  . Father Deceased 43   Family History  Problem Relation Age of Onset  . Cancer Sister   . Cancer Mother   . Heart attack Father    History   Social History  . Marital Status: Married    Spouse Name: N/A    Number of Children: 2  . Years of Education: Automotive engineer   Social History Main Topics  . Smoking status: Former Smoker -- 0.50 packs/day for 30 years    Quit date: 09/13/1992  . Smokeless tobacco: Never Used  . Alcohol Use: No     Comment: occasionally  . Drug Use: No  . Sexual Activity: Not on file   Other Topics Concern  . Not on file   Social History Narrative   Previously lived at home with spouse, but has been in the skilled nursing facility since a fall resulting in a left hip fracture in March 2014.   Caffeine Use: 1.5 cups of coffee daily      Review of Systems  Constitutional: Positive for activity change and fatigue. Negative for fever, chills, diaphoresis, appetite change and unexpected weight change.  HENT: Negative for congestion, dental problem, drooling, hearing loss, mouth sores and sore throat.   Eyes: Positive for visual disturbance (corrective lenses).  Respiratory: Negative for cough, choking, chest tightness and shortness of breath.   Cardiovascular: Negative for chest pain, palpitations and leg swelling.  Gastrointestinal: Negative.        Diverticulosis  Endocrine: Negative.   Genitourinary:       Incontinence of urine  Musculoskeletal:       Recent right fracture repaired by Dr. Durene Romans with an intramedullary nail.   Skin: Negative.   Neurological: Negative for tremors, light-headedness, numbness and headaches.       Significant dementia. History of Lewy body disease.  Hematological: Negative for adenopathy. Does not bruise/bleed easily.  Psychiatric/Behavioral: Positive for confusion. Negative for behavioral problems and agitation.       Objective:   Physical Exam  Constitutional: No distress.  Frail, elderly female.  HENT:  Head: Normocephalic and atraumatic.  Right Ear: External ear normal.  Left Ear: External ear normal.  Nose: Nose normal.  Mouth/Throat: Oropharynx is clear and moist.  Eyes: Conjunctivae and EOM  are normal. Pupils are equal, round, and reactive to light.  Neck: No JVD present. No tracheal deviation present. No thyromegaly present.  Cardiovascular: Normal rate, regular rhythm, normal heart sounds and intact distal pulses.  Exam reveals no gallop and no friction rub.   No murmur heard. Pulmonary/Chest: No respiratory distress. She has no wheezes. She has no rales. She exhibits no tenderness.  Abdominal: She exhibits no distension and no mass. There is no tenderness.  Scaphoid abdomen.  Musculoskeletal: She exhibits no edema.  Fresh incision at the right hip. Some swelling and discomfort. Bruising. Old scar on the left hip from previous fracture March 2014.  Lymphadenopathy:    She has no cervical adenopathy.  Neurological: She is alert. No cranial nerve deficit. Coordination normal.  Demented. Responds to some questions.  Skin: No rash noted. No erythema. No pallor.  Psychiatric: She has a normal mood and affect. Her behavior is normal.          Assessment & Plan:  Hip fracture, right, closed, initial encounter: Patient will be enrolled rehabilitative therapies including physical therapy and occupational therapy. Daily wound care will be done. Currently the wound is in good condition.  UTI (urinary tract infection): Antibiotics will be completed for her Escherichia  coli urine infection.  Parkinson disease: Currently there is minimal rigidity and no observable tremor.  Dementia with Lewy bodies: No significant behavioral disturbances. Dementia is significant.  Acute blood loss anemia: Followup with CBC  Hyperglycemia: Followup with BMP

## 2013-05-09 LAB — CBC AND DIFFERENTIAL
Hemoglobin: 8.8 g/dL — AB (ref 12.0–16.0)
Platelets: 430 10*3/uL — AB (ref 150–399)
WBC: 6.7 10^3/mL

## 2013-05-09 LAB — BASIC METABOLIC PANEL
BUN: 18 mg/dL (ref 4–21)
Creatinine: 0.5 mg/dL (ref 0.5–1.1)
Glucose: 96 mg/dL
Potassium: 3.6 mmol/L (ref 3.4–5.3)
Sodium: 144 mmol/L (ref 137–147)

## 2013-05-10 ENCOUNTER — Encounter: Payer: Self-pay | Admitting: Nurse Practitioner

## 2013-05-10 ENCOUNTER — Non-Acute Institutional Stay (SKILLED_NURSING_FACILITY): Payer: Medicare Other | Admitting: Nurse Practitioner

## 2013-05-10 DIAGNOSIS — F0280 Dementia in other diseases classified elsewhere without behavioral disturbance: Secondary | ICD-10-CM

## 2013-05-10 DIAGNOSIS — S72001S Fracture of unspecified part of neck of right femur, sequela: Secondary | ICD-10-CM

## 2013-05-10 DIAGNOSIS — G20 Parkinson's disease: Secondary | ICD-10-CM

## 2013-05-10 DIAGNOSIS — S72009S Fracture of unspecified part of neck of unspecified femur, sequela: Secondary | ICD-10-CM

## 2013-05-10 DIAGNOSIS — I959 Hypotension, unspecified: Secondary | ICD-10-CM

## 2013-05-10 DIAGNOSIS — D62 Acute posthemorrhagic anemia: Secondary | ICD-10-CM

## 2013-05-10 NOTE — Assessment & Plan Note (Addendum)
Not disabling, shuffling gait, takes Sinemet 25/100 1+1/2 tab tid and 1/2 at 3pm as needed daily prior to the right hip ORIF(05/01/13)

## 2013-05-10 NOTE — Progress Notes (Signed)
Patient ID: Deborah Hunt, female   DOB: September 23, 1926, 79 y.o.   MRN: [id number]  Code Status: DNR  Allergies  Allergen Reactions  . Lidocaine   . Other Other (See Comments)    Novocaine  . Penicillins   . Sulfa Antibiotics     Chief Complaint  Patient presents with  . Medical Managment of Chronic Issues    right hip pain.   . Acute Visit  . Anemia    HPI: Patient is a 79 y.o. female seen in the SNF at [facility] today for evaluation of post op anemia, right hip pain, s/p ORIF(05/01/13) of the right hip, and  her chronic medical conditions.  Problem List Items Addressed This Visit   Acute blood loss anemia - Primary     Post Op of the right hip ORIF. Hgb has improved from 8.4 to 8.8 05/09/13 in one week. Oral Iron 325mg  bid started 05/03/13.     Dementia with Lewy bodies     Doing well in Memory Care Unit,  takes Aricept 10mg  daily, last MMSE 21/30 03/21/13                  Hip fracture, right     S/p ORIF, 25% weight bearing, pain is not well controlled with Tylenol tid. Prn Tramadol and Hydrocodone were discourage per family.     Hypotension, unspecified     91/64 05/10/13. 128/56, 102/58, 110/69, 96/50, 130/68 in the past week. No taking antihypertensive agent. This may contribute to her generalized weakness and lethargy along with other issues: post op, late effect of anesthetic agents, anemia, traumatic stress, Parkinsons, and Dementia. Will continue to monitor BP and may obtain Bp lying and erect when she is able to.     Parkinson disease (Chronic)     Not disabling, shuffling gait, takes Sinemet 25/100 1+1/2 tab tid and 1/2 at 3pm as needed daily prior to the right hip ORIF(05/01/13)                     Review of Systems:  Review of Systems  Constitutional: Negative for fever, chills, weight loss, positive for malaise/fatigue, no diaphoresis.  HENT: Positive for hearing loss. Negative for congestion and sore throat.   Eyes: Negative  for pain, discharge and redness.  Respiratory: Negative for cough, sputum production and wheezing.   Cardiovascular: Negative for chest pain, palpitations, orthopnea, claudication, and PND.  The right leg moderate swelling since the right hip surgery.  Gastrointestinal: Negative for heartburn, nausea, vomiting, abdominal pain, diarrhea, constipation and blood in stool.  Genitourinary: Negative for dysuria, urgency, frequency, hematuria and flank pain.  Musculoskeletal: Positive for falls and joint pain (Right hip). Negative for back pain, myalgias and neck pain.       Ambulates with walker independently prior to the right hip surgery.   Skin: Negative for itching and rash.       Surgical wound healing nicely of the right hip. Left hip surgical scar.  Neurological: Negative for dizziness, tremors, sensory change, speech change, focal weakness, seizures, loss of consciousness and weakness.       Shuffling gait prior to the right hip ORIF Endo/Heme/Allergies: Negative for environmental allergies and polydipsia. Does not bruise/bleed easily.  Psychiatric/Behavioral: Positive for memory loss. Negative for hallucinations. Depression: flat affect. New behviros--crying episodes.  The patient is not nervous/anxious and does not have insomnia.      Past Medical History  Diagnosis Date  . Senile dementia with  depressive features 09/25/2012  . GERD (gastroesophageal reflux disease)   . Diverticulosis   . Arthritis   . Osteoporosis   . Complication of anesthesia     caused brain disease  . Paralysis agitans 09/25/2012  . Unspecified constipation 09/25/2012  . Pain in joint, pelvic region and thigh 09/25/2012.  Marland Kitchen Senile osteoporosis 09/25/2012  . Other orthopedic aftercare(V54.89) 09/25/2012  . Fracture of femoral neck, left, closed, impacted March 2014    Dr. Lequita Halt  . Intertrochanteric fracture of right femur 04/30/13    Dr. Luna Fuse  . Lewy body dementia 2013  . Urinary tract infection, site  not specified 2014  . Unspecified constipation 2013   Past Surgical History  Procedure Laterality Date  . Tubal ligation    . Nipple removal      from left breast due to Paget's disease  . Abdominal hysterectomy    . Eye surgery      bilateral cataract surgery  . Hip pinning,cannulated Left 09/14/2012    Procedure: CANNULATED HIP PINNING;  Surgeon: [name], MD;  Location: [facility];  Service: Orthopedics;  Laterality: Left;  . Femur im nail Right 05/01/2013    Procedure: INTRAMEDULLARY (IM) NAIL FEMORAL;  Surgeon: Shelda Pal, MD;  Location: [facility];  Service: Orthopedics;  Laterality: Right;   Social History:   reports that she quit smoking about 20 years ago. She has never used smokeless tobacco. She reports that she does not drink alcohol or use illicit drugs.  Family History  Problem Relation Age of Onset  . Cancer Sister   . Cancer Mother   . Heart attack Father     Medications: Patient's Medications  New Prescriptions   No medications on file  Previous Medications   ACETAMINOPHEN (TYLENOL) 500 MG TABLET    Take 500 mg by mouth 3 (three) times daily.   ASPIRIN EC 325 MG EC TABLET    Take 1 tablet (325 mg total) by mouth 2 (two) times daily.   BISACODYL (DULCOLAX) 10 MG SUPPOSITORY    Place 1 suppository (10 mg total) rectally daily as needed.   CARBIDOPA-LEVODOPA (SINEMET IR) 25-100 MG PER TABLET    Take 0.5-1.5 tablets by mouth 3 (three) times daily. Pt takes 1.5 tablets with meals. At 3p pt's caregiver gives her 1/2 tab -1 tablet depending on severity of tremors/Parkinson disease.   CIPROFLOXACIN (CIPRO) 500 MG TABLET    Take 1 tablet (500 mg total) by mouth 2 (two) times daily.   DONEPEZIL (ARICEPT) 10 MG TABLET    Take 10 mg by mouth at bedtime. Take 1 tablet once daily at bedtime.   FEEDING SUPPLEMENT (RESOURCE BREEZE) LIQD    Take 1 Container by mouth 2 (two) times daily.   FERROUS SULFATE 325 (65 FE) MG TABLET    Take 325 mg by mouth 2 (two) times daily.    HYDROCODONE-ACETAMINOPHEN (NORCO/VICODIN) 5-325 MG PER TABLET    Take 1-2 tablets by mouth every 6 (six) hours as needed.   MEMANTINE (NAMENDA) 10 MG TABLET    Take 10 mg by mouth 2 (two) times daily.   NUTRITIONAL SUPPLEMENTS (BOOST PO)    Take 1 Can by mouth 2 (two) times daily.   POLYETHYLENE GLYCOL POWDER (GLYCOLAX/MIRALAX) POWDER    Take 17 g by mouth as needed (constipation).    TRAMADOL-ACETAMINOPHEN (ULTRACET) 37.5-325 MG PER TABLET    Take 1 tablet by mouth every 6 (six) hours as needed for pain.   VITAMIN D, ERGOCALCIFEROL, (  DRISDOL) 50000 UNITS CAPS    Take 50,000 Units by mouth every 7 (seven) days. Monday- Take 1 by mouth weekly for vitamin deficiency.  Modified Medications   No medications on file  Discontinued Medications   No medications on file     Physical Exam: Physical Exam  Constitutional: She appears well-nourished.  HENT:  Head: Normocephalic and atraumatic.  Eyes: Conjunctivae and EOM are normal. Pupils are equal, round, and reactive to light.  Neck: Normal range of motion. Neck supple. No JVD present. No thyromegaly present.  Cardiovascular: Normal rate and regular rhythm.   Murmur heard. Pulmonary/Chest: No respiratory distress. She has no wheezes. She has no rales.  Abdominal: Soft. Bowel sounds are normal. She exhibits no distension and no abdominal bruit. There is no hepatosplenomegaly, splenomegaly or hepatomegaly. There is no tenderness. There is no rigidity, no rebound, no guarding, no CVA tenderness, no tenderness at McBurney's point and negative Murphy's sign. No hernia. Hernia confirmed negative in the ventral area.  Genitourinary: Guaiac negative stool.  Musculoskeletal: Normal range of motion. She exhibits tenderness (the right hip, s/p ORIF 05/01/13, incision healing nicely, moderate edema from the right hip down to the right foot ). 25% weight bearing RLE Lymphadenopathy:    She has no cervical adenopathy.  Neurological: She is alert. She displays  normal reflexes. No cranial nerve deficit. She exhibits normal muscle tone. Coordination normal.  Shuffling gait.   Skin: Skin is warm and dry.  S/p right mastectomy . Right hip surgical incision healing nicely w/o s/s of peri wound cellulitis.  Psychiatric: Her behavior is normal. Her mood appears not anxious. Her affect is not angry, not blunt, not labile and not inappropriate. Her speech is rapid and/or pressured. Her speech is not delayed, not tangential and not slurred. She is not agitated, not aggressive, not hyperactive, not slowed, not withdrawn, not actively hallucinating and not combative. Thought content is not paranoid and not delusional. Cognition and memory are impaired. She exhibits a depressed mood. She is communicative. She exhibits abnormal recent memory. She is attentive.    Filed Vitals:   05/10/13 1109  BP: 91/64  Pulse: 80  Temp: 96.6 F (35.9 C)  TempSrc: Tympanic  Resp: 20   Labs reviewed: Basic Metabolic Panel:  Recent Labs  16/10/96 0423 10/11/12  04/30/13 1200 05/02/13 0444 05/03/13 0503 05/09/13  NA 137  --   < > 143 135 134* 144  K 3.9  --   < > 3.5 3.0* 3.7 3.6  CL 106  --   --  104 102 105  --   CO2 22  --   --  27 24 24   --   GLUCOSE 93  --   --  88 92 104*  --   BUN 9  --   < > 11 8 9 18   CREATININE 0.54  --   < > 0.60 0.45* 0.52 0.5  CALCIUM 8.1*  --   --  9.8 8.2* 8.1*  --   TSH  --  1.05  --   --   --   --   --   < > = values in this interval not displayed.  CBC:  Recent Labs  08/19/12 1230 09/13/12 1924  04/30/13 1200 05/02/13 0444 05/02/13 1758 05/03/13 0503 05/09/13  WBC 9.1 14.3*  < > 10.6* 10.5 12.4* 11.1* 6.7  NEUTROABS 6.5 12.2*  --  8.5*  --   --   --   --   HGB 14.9  13.3  < > 14.4 9.5* 8.5* 8.4* 8.8*  HCT 43.1 37.4  < > 42.3 27.1* 25.1* 23.9* 26*  MCV 90.7 88.4  < > 95.3 93.4 93.3 93.4  --   PLT 221 203  < > 235 139* 140* 147* 430*  < > = values in this interval not displayed. Past Procedures:  10/07/12 [place] abd: no  definite ultrasound abnormalities of the abdomen could be identified. No gallstones in the gallbladder could be identified.   10/21/12 CXR increased interstitial findings lung fields bilaterally, which may be chronic in nature. Findings consistent with remote granulomatous disease. No evidence for congestive heart failure.   04/30/2013 CHEST - 1 VIEW IMPRESSION: No active disease.   04/30/2013 BILATERAL HIP WITH PELVIS - 4+ IMPRESSION: 1. Acute intertrochanteric fracture of the right hip. 2. No acute fracture is identified on the left with previously placed screws demonstrating stable positioning. The left femoral neck is considerably foreshortened on the images due to rotation. If there remains any significant left hip pain, recommend repeat hip radiographs.   05/02/2013 DG OPERATIVE RIGHT HIP IMPRESSION: Internal fixation of intertrochanteric fracture.   Assessment/Plan Acute blood loss anemia Post Op of the right hip ORIF. Hgb has improved from 8.4 to 8.8 05/09/13 in one week. Oral Iron 325mg  bid started 05/03/13.   Hip fracture, right S/p ORIF, 25% weight bearing, pain is not well controlled with Tylenol tid. Prn Tramadol and Hydrocodone were discourage per family.   Parkinson disease Not disabling, shuffling gait, takes Sinemet 25/100 1+1/2 tab tid and 1/2 at 3pm as needed daily prior to the right hip ORIF(05/01/13)                Dementia with Lewy bodies Doing well in Memory Care Unit,  takes Aricept 10mg  daily, last MMSE 21/30 03/21/13                Hypotension, unspecified 91/64 05/10/13. 128/56, 102/58, 110/69, 96/50, 130/68 in the past week. No taking antihypertensive agent. This may contribute to her generalized weakness and lethargy along with other issues: post op, late effect of anesthetic agents, anemia, traumatic stress, Parkinsons, and Dementia. Will continue to monitor BP and may obtain Bp lying and erect when she is able to.     Family/ Staff  Communication: observe the patient. PT/OT to restore the patient ADL to the level prior the right hip ORIF   Goals of Care: SNF, [facility]  Labs/tests ordered: CBC and BMP done 05/09/13

## 2013-05-10 NOTE — Assessment & Plan Note (Signed)
Doing well in Memory Care Unit,  takes Aricept 10mg daily, last MMSE 21/30 03/21/13           

## 2013-05-10 NOTE — Assessment & Plan Note (Signed)
S/p ORIF, 25% weight bearing, pain is not well controlled with Tylenol tid. Prn Tramadol and Hydrocodone were discourage per family.

## 2013-05-10 NOTE — Assessment & Plan Note (Signed)
Post Op of the right hip ORIF. Hgb has improved from 8.4 to 8.8 05/09/13 in one week. Oral Iron 325mg  bid started 05/03/13.

## 2013-05-10 NOTE — Assessment & Plan Note (Signed)
91/64 05/10/13. 128/56, 102/58, 110/69, 96/50, 130/68 in the past week. No taking antihypertensive agent. This may contribute to her generalized weakness and lethargy along with other issues: post op, late effect of anesthetic agents, anemia, traumatic stress, Parkinsons, and Dementia. Will continue to monitor BP and may obtain Bp lying and erect when she is able to.

## 2013-06-08 ENCOUNTER — Encounter: Payer: Self-pay | Admitting: Nurse Practitioner

## 2013-06-08 ENCOUNTER — Non-Acute Institutional Stay (SKILLED_NURSING_FACILITY): Payer: Medicare Other | Admitting: Nurse Practitioner

## 2013-06-08 DIAGNOSIS — S72009S Fracture of unspecified part of neck of unspecified femur, sequela: Secondary | ICD-10-CM

## 2013-06-08 DIAGNOSIS — F329 Major depressive disorder, single episode, unspecified: Secondary | ICD-10-CM

## 2013-06-08 DIAGNOSIS — D62 Acute posthemorrhagic anemia: Secondary | ICD-10-CM

## 2013-06-08 DIAGNOSIS — F0280 Dementia in other diseases classified elsewhere without behavioral disturbance: Secondary | ICD-10-CM

## 2013-06-08 DIAGNOSIS — K5900 Constipation, unspecified: Secondary | ICD-10-CM

## 2013-06-08 DIAGNOSIS — R5381 Other malaise: Secondary | ICD-10-CM

## 2013-06-08 DIAGNOSIS — S72001S Fracture of unspecified part of neck of right femur, sequela: Secondary | ICD-10-CM

## 2013-06-08 DIAGNOSIS — G20 Parkinson's disease: Secondary | ICD-10-CM

## 2013-06-08 DIAGNOSIS — I959 Hypotension, unspecified: Secondary | ICD-10-CM

## 2013-06-08 NOTE — Assessment & Plan Note (Addendum)
Sleeps and eats well, her weights -#80 Ibs (#75-#85 since March 2014). Dc'd Mirtazapine due to her drowsiness. Flat affect and slow responses.

## 2013-06-08 NOTE — Assessment & Plan Note (Signed)
Doing well in Memory Care Unit,  takes Aricept 10mg daily and Namenda 10mg bid, last MMSE 21/30 03/21/13                 

## 2013-06-08 NOTE — Progress Notes (Signed)
Patient ID: Deborah Hunt, female   DOB: 1926/09/04, 79 y.o.   MRN: [id number]  Code Status: DNR  Allergies  Allergen Reactions  . Lidocaine   . Other Other (See Comments)    Novocaine  . Penicillins   . Sulfa Antibiotics     Chief Complaint  Patient presents with  . Medical Managment of Chronic Issues  . Dementia    HPI: Patient is a 79 y.o. female seen in the SNF at [facility] today for evaluation of post op anemia, right hip pain, s/p ORIF(05/01/13) of the right hip, and  her chronic medical conditions.  Problem List Items Addressed This Visit   Unspecified constipation - Primary     Adequate with MiraLax daily prn. Also Dulcolax suppository daily prn available to her.                   Parkinson disease (Chronic)     Not disabling, shuffling gait, takes Sinemet 25/100 1+1/2 tab tid and 1/2 at 3pm as needed daily.                   Other malaise and fatigue     Slightly better.     Hypotension, unspecified     Bp 90/60s at her baseline.     Hip fracture, right     S/p ORIF, WBAT, still working with Therapy, pain is reasonably controlled with Tylenol tid. Prn Tramadol and Hydrocodone available to her      Depression due to dementia     Sleeps and eats well, her weights -#80 Ibs (#75-#85 since March 2014). Dc'd Mirtazapine due to her drowsiness. Flat affect and slow responses.         Dementia with Lewy bodies     Doing well in Memory Care Unit,  takes Aricept 10mg  daily and Namenda 10mg  bid, last MMSE 21/30 03/21/13                  Acute blood loss anemia     Post Op of the right hip ORIF. Hgb has improved from 8.4 to 8.8 05/09/13 in one week. Oral Iron 325mg  bid started 05/03/13. Update CBC         Review of Systems  Constitutional: Positive for malaise/fatigue. Negative for fever, chills, weight loss and diaphoresis.  HENT: Positive for hearing loss. Negative for congestion and sore throat.   Eyes:  Negative for pain, discharge and redness.  Respiratory: Negative for cough, sputum production and wheezing.   Cardiovascular: Negative for chest pain, palpitations, orthopnea, claudication, leg swelling and PND.  Gastrointestinal: Negative for heartburn, nausea, vomiting, abdominal pain, diarrhea, constipation and blood in stool.  Genitourinary: Negative for dysuria, urgency, frequency, hematuria and flank pain.  Musculoskeletal: Positive for falls and joint pain (left hip). Negative for back pain, myalgias and neck pain.       Ambulates with walker with therapy since the right hip fx-ORIF  Skin: Negative for itching and rash.       Surgical wound healed.   Neurological: Negative for dizziness, tremors, sensory change, speech change, focal weakness, seizures, loss of consciousness and weakness.       Shuffling gait  Endo/Heme/Allergies: Negative for environmental allergies and polydipsia. Does not bruise/bleed easily.  Psychiatric/Behavioral: Positive for memory loss. Negative for hallucinations. Depression: flat affect. New behviros--crying episodes.  The patient is not nervous/anxious and does not have insomnia.        Flat affect and slow responses.  Past Medical History  Diagnosis Date  . Senile dementia with depressive features 09/25/2012  . GERD (gastroesophageal reflux disease)   . Diverticulosis   . Arthritis   . Osteoporosis   . Complication of anesthesia     caused brain disease  . Paralysis agitans 09/25/2012  . Unspecified constipation 09/25/2012  . Pain in joint, pelvic region and thigh 09/25/2012.  Marland Kitchen Senile osteoporosis 09/25/2012  . Other orthopedic aftercare(V54.89) 09/25/2012  . Fracture of femoral neck, left, closed, impacted March 2014    Dr. Lequita Halt  . Intertrochanteric fracture of right femur 04/30/13    Dr. Luna Fuse  . Lewy body dementia 2013  . Urinary tract infection, site not specified 2014  . Unspecified constipation 2013   Past Surgical History    Procedure Laterality Date  . Tubal ligation    . Nipple removal      from left breast due to Paget's disease  . Abdominal hysterectomy    . Eye surgery      bilateral cataract surgery  . Hip pinning,cannulated Left 09/14/2012    Procedure: CANNULATED HIP PINNING;  Surgeon: [name], MD;  Location: [facility];  Service: Orthopedics;  Laterality: Left;  . Femur im nail Right 05/01/2013    Procedure: INTRAMEDULLARY (IM) NAIL FEMORAL;  Surgeon: Shelda Pal, MD;  Location: [facility];  Service: Orthopedics;  Laterality: Right;   Social History:   reports that she quit smoking about 20 years ago. She has never used smokeless tobacco. She reports that she does not drink alcohol or use illicit drugs.  Family History  Problem Relation Age of Onset  . Cancer Sister   . Cancer Mother   . Heart attack Father     Medications: Patient's Medications  New Prescriptions   No medications on file  Previous Medications   ACETAMINOPHEN (TYLENOL) 500 MG TABLET    Take 500 mg by mouth 3 (three) times daily.   BISACODYL (DULCOLAX) 10 MG SUPPOSITORY    Place 1 suppository (10 mg total) rectally daily as needed.   CARBIDOPA-LEVODOPA (SINEMET IR) 25-100 MG PER TABLET    Take 0.5-1.5 tablets by mouth 3 (three) times daily. Pt takes 1.5 tablets with meals. At 3p pt's caregiver gives her 1/2 tab -1 tablet depending on severity of tremors/Parkinson disease.   DONEPEZIL (ARICEPT) 10 MG TABLET    Take 10 mg by mouth at bedtime. Take 1 tablet once daily at bedtime.   FEEDING SUPPLEMENT (RESOURCE BREEZE) LIQD    Take 1 Container by mouth 2 (two) times daily.   FERROUS SULFATE 325 (65 FE) MG TABLET    Take 325 mg by mouth 2 (two) times daily.   HYDROCODONE-ACETAMINOPHEN (NORCO/VICODIN) 5-325 MG PER TABLET    Take 1-2 tablets by mouth every 6 (six) hours as needed.   MEMANTINE (NAMENDA) 10 MG TABLET    Take 10 mg by mouth 2 (two) times daily.   NUTRITIONAL SUPPLEMENTS (BOOST PO)    Take 1 Can by mouth 2 (two) times  daily.   POLYETHYLENE GLYCOL POWDER (GLYCOLAX/MIRALAX) POWDER    Take 17 g by mouth as needed (constipation).    TRAMADOL-ACETAMINOPHEN (ULTRACET) 37.5-325 MG PER TABLET    Take 1 tablet by mouth every 6 (six) hours as needed for pain.   VITAMIN D, ERGOCALCIFEROL, (DRISDOL) 50000 UNITS CAPS    Take 50,000 Units by mouth every 7 (seven) days. Monday- Take 1 by mouth weekly for vitamin deficiency.  Modified Medications   No medications on  file  Discontinued Medications   CIPROFLOXACIN (CIPRO) 500 MG TABLET    Take 1 tablet (500 mg total) by mouth 2 (two) times daily.     Physical Exam  Constitutional: No distress.  Frail, elderly female.  HENT:  Head: Normocephalic and atraumatic.  Right Ear: External ear normal.  Left Ear: External ear normal.  Nose: Nose normal.  Mouth/Throat: Oropharynx is clear and moist.  Eyes: Conjunctivae and EOM are normal. Pupils are equal, round, and reactive to light.  Neck: No JVD present. No tracheal deviation present. No thyromegaly present.  Cardiovascular: Normal rate, regular rhythm, normal heart sounds and intact distal pulses.  Exam reveals no gallop and no friction rub.   No murmur heard. Pulmonary/Chest: No respiratory distress. She has no wheezes. She has no rales. She exhibits no tenderness.  Abdominal: She exhibits no distension and no mass. There is no tenderness.  Scaphoid abdomen.  Musculoskeletal: She exhibits no edema.  Right hip ORIF surgical scar from 04/2013. Old scar on the left hip from previous fracture March 2014.  Lymphadenopathy:    She has no cervical adenopathy.  Neurological: She is alert. No cranial nerve deficit. Coordination normal.  Demented. Responds to some questions.  Skin: No rash noted. No erythema. No pallor.  Surgical scars from hx of left nipple removal and abd hysterectomy. R+L hip ORIF surgical scars.   Psychiatric: She has a normal mood and affect. Her behavior is normal.  Flat affect and slow responses.      Filed Vitals:   06/08/13 1020  BP: 96/74  Pulse: 88  Temp: 98.2 F (36.8 C)  TempSrc: Tympanic  Resp: 18   Labs reviewed: Basic Metabolic Panel:  Recent Labs  11/91/47 0423 10/11/12  04/30/13 1200 05/02/13 0444 05/03/13 0503 05/09/13  NA 137  --   < > 143 135 134* 144  K 3.9  --   < > 3.5 3.0* 3.7 3.6  CL 106  --   --  104 102 105  --   CO2 22  --   --  27 24 24   --   GLUCOSE 93  --   --  88 92 104*  --   BUN 9  --   < > 11 8 9 18   CREATININE 0.54  --   < > 0.60 0.45* 0.52 0.5  CALCIUM 8.1*  --   --  9.8 8.2* 8.1*  --   TSH  --  1.05  --   --   --   --   --   < > = values in this interval not displayed.  CBC:  Recent Labs  08/19/12 1230 09/13/12 1924  04/30/13 1200 05/02/13 0444 05/02/13 1758 05/03/13 0503 05/09/13  WBC 9.1 14.3*  < > 10.6* 10.5 12.4* 11.1* 6.7  NEUTROABS 6.5 12.2*  --  8.5*  --   --   --   --   HGB 14.9 13.3  < > 14.4 9.5* 8.5* 8.4* 8.8*  HCT 43.1 37.4  < > 42.3 27.1* 25.1* 23.9* 26*  MCV 90.7 88.4  < > 95.3 93.4 93.3 93.4  --   PLT 221 203  < > 235 139* 140* 147* 430*  < > = values in this interval not displayed. Past Procedures:  10/07/12 [place] abd: no definite ultrasound abnormalities of the abdomen could be identified. No gallstones in the gallbladder could be identified.   10/21/12 CXR increased interstitial findings lung fields bilaterally, which may be chronic in nature.  Findings consistent with remote granulomatous disease. No evidence for congestive heart failure.   04/30/2013 CHEST - 1 VIEW IMPRESSION: No active disease.   04/30/2013 BILATERAL HIP WITH PELVIS - 4+ IMPRESSION: 1. Acute intertrochanteric fracture of the right hip. 2. No acute fracture is identified on the left with previously placed screws demonstrating stable positioning. The left femoral neck is considerably foreshortened on the images due to rotation. If there remains any significant left hip pain, recommend repeat hip radiographs.   05/02/2013 DG OPERATIVE RIGHT  HIP IMPRESSION: Internal fixation of intertrochanteric fracture.   Assessment/Plan Unspecified constipation Adequate with MiraLax daily prn. Also Dulcolax suppository daily prn available to her.                 Parkinson disease Not disabling, shuffling gait, takes Sinemet 25/100 1+1/2 tab tid and 1/2 at 3pm as needed daily.                 Other malaise and fatigue Slightly better.   Hypotension, unspecified Bp 90/60s at her baseline.   Hip fracture, right S/p ORIF, WBAT, still working with Therapy, pain is reasonably controlled with Tylenol tid. Prn Tramadol and Hydrocodone available to her    Depression due to dementia Sleeps and eats well, her weights -#80 Ibs (#75-#85 since March 2014). Dc'd Mirtazapine due to her drowsiness. Flat affect and slow responses.       Dementia with Lewy bodies Doing well in Memory Care Unit,  takes Aricept 10mg  daily and Namenda 10mg  bid, last MMSE 21/30 03/21/13                Acute blood loss anemia Post Op of the right hip ORIF. Hgb has improved from 8.4 to 8.8 05/09/13 in one week. Oral Iron 325mg  bid started 05/03/13. Update CBC      Family/ Staff Communication: observe the patient. PT/OT to restore the patient ADL to the level prior the right hip ORIF   Goals of Care: SNF, [facility]  Labs/tests ordered: CBC

## 2013-06-08 NOTE — Assessment & Plan Note (Signed)
Post Op of the right hip ORIF. Hgb has improved from 8.4 to 8.8 05/09/13 in one week. Oral Iron 325mg  bid started 05/03/13. Update CBC

## 2013-06-08 NOTE — Assessment & Plan Note (Signed)
Adequate with MiraLax daily prn. Also Dulcolax suppository daily prn available to her.                  

## 2013-06-08 NOTE — Assessment & Plan Note (Signed)
Not disabling, shuffling gait, takes Sinemet 25/100 1+1/2 tab tid and 1/2 at 3pm as needed daily.                  

## 2013-06-08 NOTE — Assessment & Plan Note (Signed)
Bp 90/60s at her baseline.

## 2013-06-08 NOTE — Assessment & Plan Note (Signed)
S/p ORIF, WBAT, still working with Therapy, pain is reasonably controlled with Tylenol tid. Prn Tramadol and Hydrocodone available to her

## 2013-06-08 NOTE — Assessment & Plan Note (Signed)
Slightly better.

## 2013-06-13 LAB — CBC AND DIFFERENTIAL
HCT: 32 % — AB (ref 36–46)
HEMOGLOBIN: 11 g/dL — AB (ref 12.0–16.0)
Platelets: 279 10*3/uL (ref 150–399)
WBC: 4.7 10*3/mL

## 2013-07-26 ENCOUNTER — Non-Acute Institutional Stay (SKILLED_NURSING_FACILITY): Payer: Medicare Other | Admitting: Nurse Practitioner

## 2013-07-26 ENCOUNTER — Encounter: Payer: Self-pay | Admitting: Nurse Practitioner

## 2013-07-26 DIAGNOSIS — K5900 Constipation, unspecified: Secondary | ICD-10-CM

## 2013-07-26 DIAGNOSIS — F0393 Unspecified dementia, unspecified severity, with mood disturbance: Secondary | ICD-10-CM

## 2013-07-26 DIAGNOSIS — F3289 Other specified depressive episodes: Secondary | ICD-10-CM

## 2013-07-26 DIAGNOSIS — F0280 Dementia in other diseases classified elsewhere without behavioral disturbance: Secondary | ICD-10-CM

## 2013-07-26 DIAGNOSIS — G20 Parkinson's disease: Secondary | ICD-10-CM

## 2013-07-26 DIAGNOSIS — F329 Major depressive disorder, single episode, unspecified: Secondary | ICD-10-CM

## 2013-07-26 DIAGNOSIS — D62 Acute posthemorrhagic anemia: Secondary | ICD-10-CM

## 2013-07-26 DIAGNOSIS — G3183 Dementia with Lewy bodies: Secondary | ICD-10-CM

## 2013-07-26 NOTE — Assessment & Plan Note (Signed)
Adequate with MiraLax daily prn. Also Dulcolax suppository daily prn available to her.

## 2013-07-26 NOTE — Assessment & Plan Note (Signed)
Not disabling, shuffling gait, takes Sinemet 25/100 1+1/2 tab tid and 1/2 at 3pm as needed daily.

## 2013-07-26 NOTE — Assessment & Plan Note (Signed)
Cymbalta 30mg  daily started 07/17/13-too soon to evaluate-continue to monitor the patient.

## 2013-07-26 NOTE — Assessment & Plan Note (Signed)
Doing well in Memory Care Unit,  takes Aricept 10mg  daily and Namenda 10mg  bid, last MMSE 21/30 03/21/13

## 2013-07-26 NOTE — Assessment & Plan Note (Signed)
Post Op of the right hip ORIF. Hgb has improved from 8.4 to 8.8 05/09/13 in one week and again 11.0 06/13/13. Will dc oral Iron 325mg  bid started 05/03/13. Update CBC in 2 weeks.

## 2013-07-26 NOTE — Progress Notes (Signed)
Patient ID: Deborah Hunt, female   DOB: [birth year], 80 y.o.   MRN: [id number]   Code Status: DNR  Allergies  Allergen Reactions  . Lidocaine   . Other Other (See Comments)    Novocaine  . Penicillins   . Sulfa Antibiotics     Chief Complaint  Patient presents with  . Medical Managment of Chronic Issues    HPI: Patient is a 80 y.o. female seen in the SNF at [facility] today for evaluation of post op anemia, right hip pain, s/p ORIF(05/01/13) of the right hip, and  her chronic medical conditions.  Problem List Items Addressed This Visit   Acute blood loss anemia - Primary     Post Op of the right hip ORIF. Hgb has improved from 8.4 to 8.8 05/09/13 in one week and again 11.0 06/13/13. Will dc oral Iron 325mg  bid started 05/03/13. Update CBC in 2 weeks.         Dementia with Lewy bodies     Doing well in Memory Care Unit,  takes Aricept 10mg  daily and Namenda 10mg  bid, last MMSE 21/30 03/21/13                    Depression due to dementia     Cymbalta 30mg  daily started 07/17/13-too soon to evaluate-continue to monitor the patient.     Parkinson disease (Chronic)     Not disabling, shuffling gait, takes Sinemet 25/100 1+1/2 tab tid and 1/2 at 3pm as needed daily.                     Unspecified constipation     Adequate with MiraLax daily prn. Also Dulcolax suppository daily prn available to her.                        Review of Systems  Constitutional: Positive for malaise/fatigue. Negative for fever, chills, weight loss and diaphoresis.  HENT: Positive for hearing loss. Negative for congestion and sore throat.   Eyes: Negative for pain, discharge and redness.  Respiratory: Negative for cough, sputum production and wheezing.   Cardiovascular: Negative for chest pain, palpitations, orthopnea, claudication, leg swelling and PND.  Gastrointestinal: Negative for heartburn, nausea, vomiting, abdominal pain, diarrhea,  constipation and blood in stool.  Genitourinary: Negative for dysuria, urgency, frequency, hematuria and flank pain.  Musculoskeletal: Positive for falls and joint pain (left hip). Negative for back pain, myalgias and neck pain.       Ambulates with walker with therapy since the right hip fx-ORIF  Skin: Negative for itching and rash.       Surgical wound healed.   Neurological: Negative for dizziness, tremors, sensory change, speech change, focal weakness, seizures, loss of consciousness and weakness.       Shuffling gait  Endo/Heme/Allergies: Negative for environmental allergies and polydipsia. Does not bruise/bleed easily.  Psychiatric/Behavioral: Positive for memory loss. Negative for hallucinations. Depression: flat affect. New behviros--crying episodes.  The patient is not nervous/anxious and does not have insomnia.        Flat affect and slow responses.      Past Medical History  Diagnosis Date  . Senile dementia with depressive features 09/25/2012  . GERD (gastroesophageal reflux disease)   . Diverticulosis   . Arthritis   . Osteoporosis   . Complication of anesthesia     caused brain disease  . Paralysis agitans 09/25/2012  . Unspecified constipation 09/25/2012  . Pain  in joint, pelvic region and thigh 09/25/2012.  Marland Kitchen. Senile osteoporosis 09/25/2012  . Other orthopedic aftercare(V54.89) 09/25/2012  . Fracture of femoral neck, left, closed, impacted March 2014    Dr. Lequita HaltAluisio  . Intertrochanteric fracture of right femur 04/30/13    Dr. Luna FuseM. Olin  . Lewy body dementia 2013  . Urinary tract infection, site not specified 2014  . Unspecified constipation 2013   Past Surgical History  Procedure Laterality Date  . Tubal ligation    . Nipple removal      from left breast due to Paget's disease  . Abdominal hysterectomy    . Eye surgery      bilateral cataract surgery  . Hip pinning,cannulated Left 09/14/2012    Procedure: CANNULATED HIP PINNING;  Surgeon: Loanne DrillingFrank V Aluisio, MD;   Location: [facility];  Service: Orthopedics;  Laterality: Left;  . Femur im nail Right 05/01/2013    Procedure: INTRAMEDULLARY (IM) NAIL FEMORAL;  Surgeon: Shelda PalMatthew D Olin, MD;  Location: [facility];  Service: Orthopedics;  Laterality: Right;   Social History:   reports that she quit smoking about 20 years ago. She has never used smokeless tobacco. She reports that she does not drink alcohol or use illicit drugs.  Family History  Problem Relation Age of Onset  . Cancer Sister   . Cancer Mother   . Heart attack Father     Medications: Patient's Medications  New Prescriptions   No medications on file  Previous Medications   ACETAMINOPHEN (TYLENOL) 500 MG TABLET    Take 500 mg by mouth 3 (three) times daily.   BISACODYL (DULCOLAX) 10 MG SUPPOSITORY    Place 1 suppository (10 mg total) rectally daily as needed.   CARBIDOPA-LEVODOPA (SINEMET IR) 25-100 MG PER TABLET    Take 0.5-1.5 tablets by mouth 3 (three) times daily. Pt takes 1.5 tablets with meals. At 3p pt's caregiver gives her 1/2 tab -1 tablet depending on severity of tremors/Parkinson disease.   DONEPEZIL (ARICEPT) 10 MG TABLET    Take 10 mg by mouth at bedtime. Take 1 tablet once daily at bedtime.   FEEDING SUPPLEMENT (RESOURCE BREEZE) LIQD    Take 1 Container by mouth 2 (two) times daily.   FERROUS SULFATE 325 (65 FE) MG TABLET    Take 325 mg by mouth 2 (two) times daily.   HYDROCODONE-ACETAMINOPHEN (NORCO/VICODIN) 5-325 MG PER TABLET    Take 1-2 tablets by mouth every 6 (six) hours as needed.   MEMANTINE (NAMENDA) 10 MG TABLET    Take 10 mg by mouth 2 (two) times daily.   NUTRITIONAL SUPPLEMENTS (BOOST PO)    Take 1 Can by mouth 2 (two) times daily.   POLYETHYLENE GLYCOL POWDER (GLYCOLAX/MIRALAX) POWDER    Take 17 g by mouth as needed (constipation).    TRAMADOL-ACETAMINOPHEN (ULTRACET) 37.5-325 MG PER TABLET    Take 1 tablet by mouth every 6 (six) hours as needed for pain.   VITAMIN D, ERGOCALCIFEROL, (DRISDOL) 50000 UNITS CAPS    Take  50,000 Units by mouth every 7 (seven) days. Monday- Take 1 by mouth weekly for vitamin deficiency.  Modified Medications   No medications on file  Discontinued Medications   No medications on file     Physical Exam  Constitutional: No distress.  Frail, elderly female.  HENT:  Head: Normocephalic and atraumatic.  Right Ear: External ear normal.  Left Ear: External ear normal.  Nose: Nose normal.  Mouth/Throat: Oropharynx is clear and moist.  Eyes: Conjunctivae and EOM  are normal. Pupils are equal, round, and reactive to light.  Neck: No JVD present. No tracheal deviation present. No thyromegaly present.  Cardiovascular: Normal rate, regular rhythm, normal heart sounds and intact distal pulses.  Exam reveals no gallop and no friction rub.   No murmur heard. Pulmonary/Chest: No respiratory distress. She has no wheezes. She has no rales. She exhibits no tenderness.  Abdominal: She exhibits no distension and no mass. There is no tenderness.  Scaphoid abdomen.  Musculoskeletal: She exhibits no edema.  Right hip ORIF surgical scar from 04/2013. Old scar on the left hip from previous fracture March 2014.  Lymphadenopathy:    She has no cervical adenopathy.  Neurological: She is alert. No cranial nerve deficit. Coordination normal.  Demented. Responds to some questions.  Skin: No rash noted. No erythema. No pallor.  Surgical scars from hx of left nipple removal and abd hysterectomy. R+L hip ORIF surgical scars.   Psychiatric: She has a normal mood and affect. Her behavior is normal.  Flat affect and slow responses.     Filed Vitals:   07/26/13 0918  BP: 96/74  Pulse: 88  Temp: 98.2 F (36.8 C)  TempSrc: Tympanic  Resp: 18   Labs reviewed: Basic Metabolic Panel:  Recent Labs  16/10/96 0423 10/11/12  04/30/13 1200 05/02/13 0444 05/03/13 0503 05/09/13  NA 137  --   < > 143 135 134* 144  K 3.9  --   < > 3.5 3.0* 3.7 3.6  CL 106  --   --  104 102 105  --   CO2 22  --   --   27 24 24   --   GLUCOSE 93  --   --  88 92 104*  --   BUN 9  --   < > 11 8 9 18   CREATININE 0.54  --   < > 0.60 0.45* 0.52 0.5  CALCIUM 8.1*  --   --  9.8 8.2* 8.1*  --   TSH  --  1.05  --   --   --   --   --   < > = values in this interval not displayed.  CBC:  Recent Labs  08/19/12 1230 09/13/12 1924  04/30/13 1200 05/02/13 0444 05/02/13 1758 05/03/13 0503 05/09/13 06/13/13  WBC 9.1 14.3*  < > 10.6* 10.5 12.4* 11.1* 6.7 4.7  NEUTROABS 6.5 12.2*  --  8.5*  --   --   --   --   --   HGB 14.9 13.3  < > 14.4 9.5* 8.5* 8.4* 8.8* 11.0*  HCT 43.1 37.4  < > 42.3 27.1* 25.1* 23.9* 26* 32*  MCV 90.7 88.4  < > 95.3 93.4 93.3 93.4  --   --   PLT 221 203  < > 235 139* 140* 147* 430* 279  < > = values in this interval not displayed. Past Procedures:  10/07/12 [place] abd: no definite ultrasound abnormalities of the abdomen could be identified. No gallstones in the gallbladder could be identified.   10/21/12 CXR increased interstitial findings lung fields bilaterally, which may be chronic in nature. Findings consistent with remote granulomatous disease. No evidence for congestive heart failure.   04/30/2013 CHEST - 1 VIEW IMPRESSION: No active disease.   04/30/2013 BILATERAL HIP WITH PELVIS - 4+ IMPRESSION: 1. Acute intertrochanteric fracture of the right hip. 2. No acute fracture is identified on the left with previously placed screws demonstrating stable positioning. The left femoral neck is considerably foreshortened  on the images due to rotation. If there remains any significant left hip pain, recommend repeat hip radiographs.   05/02/2013 DG OPERATIVE RIGHT HIP IMPRESSION: Internal fixation of intertrochanteric fracture.   Assessment/Plan Acute blood loss anemia Post Op of the right hip ORIF. Hgb has improved from 8.4 to 8.8 05/09/13 in one week and again 11.0 06/13/13. Will dc oral Iron 325mg  bid started 05/03/13. Update CBC in 2 weeks.       Unspecified constipation Adequate with MiraLax  daily prn. Also Dulcolax suppository daily prn available to her.                   Parkinson disease Not disabling, shuffling gait, takes Sinemet 25/100 1+1/2 tab tid and 1/2 at 3pm as needed daily.                   Dementia with Lewy bodies Doing well in Memory Care Unit,  takes Aricept 10mg  daily and Namenda 10mg  bid, last MMSE 21/30 03/21/13                  Depression due to dementia Cymbalta 30mg  daily started 07/17/13-too soon to evaluate-continue to monitor the patient.     Family/ Staff Communication: observe the patient.   Goals of Care: SNF  Labs/tests ordered: none

## 2013-08-10 LAB — CBC AND DIFFERENTIAL
HCT: 34 % — AB (ref 36–46)
Hemoglobin: 11.7 g/dL — AB (ref 12.0–16.0)
Platelets: 183 K/µL (ref 150–399)
WBC: 5.5 10*3/mL

## 2013-08-23 ENCOUNTER — Encounter: Payer: Self-pay | Admitting: Nurse Practitioner

## 2013-08-23 ENCOUNTER — Non-Acute Institutional Stay (SKILLED_NURSING_FACILITY): Payer: Medicare Other | Admitting: Nurse Practitioner

## 2013-08-23 DIAGNOSIS — R627 Adult failure to thrive: Secondary | ICD-10-CM | POA: Insufficient documentation

## 2013-08-23 DIAGNOSIS — G20A1 Parkinson's disease without dyskinesia, without mention of fluctuations: Secondary | ICD-10-CM

## 2013-08-23 DIAGNOSIS — E559 Vitamin D deficiency, unspecified: Secondary | ICD-10-CM

## 2013-08-23 DIAGNOSIS — F3289 Other specified depressive episodes: Secondary | ICD-10-CM

## 2013-08-23 DIAGNOSIS — D62 Acute posthemorrhagic anemia: Secondary | ICD-10-CM

## 2013-08-23 DIAGNOSIS — F0393 Unspecified dementia, unspecified severity, with mood disturbance: Secondary | ICD-10-CM

## 2013-08-23 DIAGNOSIS — F329 Major depressive disorder, single episode, unspecified: Secondary | ICD-10-CM

## 2013-08-23 DIAGNOSIS — G20 Parkinson's disease: Secondary | ICD-10-CM

## 2013-08-23 DIAGNOSIS — K5900 Constipation, unspecified: Secondary | ICD-10-CM

## 2013-08-23 DIAGNOSIS — M1990 Unspecified osteoarthritis, unspecified site: Secondary | ICD-10-CM

## 2013-08-23 DIAGNOSIS — G3183 Dementia with Lewy bodies: Secondary | ICD-10-CM

## 2013-08-23 DIAGNOSIS — F0280 Dementia in other diseases classified elsewhere without behavioral disturbance: Secondary | ICD-10-CM

## 2013-08-23 NOTE — Assessment & Plan Note (Signed)
Simplify medications, comfort measures, supportive care.

## 2013-08-23 NOTE — Assessment & Plan Note (Signed)
Continue with MiraLax qod and prn Bisacodyl.

## 2013-08-23 NOTE — Assessment & Plan Note (Signed)
Hx of R+L hip fxs surgical repair. W/C dependent. Will have Morphine prn available to her to ensure comfort care.

## 2013-08-23 NOTE — Assessment & Plan Note (Signed)
Dc Vit D: R>B presently.

## 2013-08-23 NOTE — Assessment & Plan Note (Signed)
Refused her Sinemet for 2 days-deconditioning overall-total care for her ADLs-dc Sinemet

## 2013-08-23 NOTE — Progress Notes (Signed)
Patient ID: Deborah Hunt, female   DOB: 12-16-1926, 80 y.o.   MRN: [id number]   Code Status: DNR  Allergies  Allergen Reactions  . Lidocaine   . Other Other (See Comments)    Novocaine  . Penicillins   . Sulfa Antibiotics     Chief Complaint  Patient presents with  . Medical Managment of Chronic Issues    end of life care  . Dementia    HPI: Patient is a 80 y.o. female seen in the SNF at [facility] today for evaluation of FTT, right hip pain, s/p ORIF(05/01/13) of the right hip, and  her chronic medical conditions.  Problem List Items Addressed This Visit   Unspecified vitamin D deficiency     Dc Vit D: R>B presently.     Unspecified constipation     Continue with MiraLax qod and prn Bisacodyl.     Parkinson disease (Chronic)     Refused her Sinemet for 2 days-deconditioning overall-total care for her ADLs-dc Sinemet    Osteoarthritis     Hx of R+L hip fxs surgical repair. W/C dependent. Will have Morphine prn available to her to ensure comfort care.     Relevant Medications      morphine (ROXANOL) 20 MG/ML concentrated solution   FTT (failure to thrive) in adult     Simplify medications, comfort measures, supportive care.     Depression due to dementia     Efficacy of Cymbalta is questionable-persisted flat affect, refusal of food and medications, continue weight loss-#7Ibs in the past 3 months, total dependent of her ADLs. Dc Cymbalta. Continue with comfort care.     Relevant Medications      LORazepam (ATIVAN) 0.5 MG tablet   Dementia with Lewy bodies - Primary     Progressing rapidly in the past year, especially after her hip fx surgery 04/2013-will discontinue memory preserving med. Comfort care. Will have Ativan prn available to her.     Acute blood loss anemia     Has been resolved, last Hgb 11s.        Review of Systems  Constitutional: Positive for weight loss and malaise/fatigue. Negative for fever, chills and diaphoresis.       Generalized  weakness.   HENT: Positive for hearing loss. Negative for congestion and sore throat.   Eyes: Negative for pain, discharge and redness.  Respiratory: Negative for cough, sputum production and wheezing.   Cardiovascular: Negative for chest pain, palpitations, orthopnea, claudication, leg swelling and PND.  Gastrointestinal: Negative for heartburn, nausea, vomiting, abdominal pain, diarrhea, constipation and blood in stool.  Genitourinary: Negative for dysuria, urgency, frequency, hematuria and flank pain.  Musculoskeletal: Positive for falls and joint pain (left hip). Negative for back pain, myalgias and neck pain.       W/c dependent due to generalized weakness.   Skin: Negative for itching and rash.  Neurological: Positive for tremors and weakness. Negative for dizziness, sensory change, speech change, focal weakness, seizures and loss of consciousness.       Generalized stiffness in all limbs.   Endo/Heme/Allergies: Negative for environmental allergies and polydipsia. Does not bruise/bleed easily.  Psychiatric/Behavioral: Positive for memory loss. Negative for hallucinations. Depression: flat affect. New behviros--crying episodes.  The patient is not nervous/anxious and does not have insomnia.        Flat affect and slow responses.      Past Medical History  Diagnosis Date  . Senile dementia with depressive features 09/25/2012  . GERD (  gastroesophageal reflux disease)   . Diverticulosis   . Arthritis   . Osteoporosis   . Complication of anesthesia     caused brain disease  . Paralysis agitans 09/25/2012  . Unspecified constipation 09/25/2012  . Pain in joint, pelvic region and thigh 09/25/2012.  Marland Kitchen Senile osteoporosis 09/25/2012  . Other orthopedic aftercare(V54.89) 09/25/2012  . Fracture of femoral neck, left, closed, impacted March 2014    Dr. Lequita Halt  . Intertrochanteric fracture of right femur 04/30/13    Dr. Luna Fuse  . Lewy body dementia 2013  . Urinary tract infection,  site not specified 2014  . Unspecified constipation 2013   Past Surgical History  Procedure Laterality Date  . Tubal ligation    . Nipple removal      from left breast due to Paget's disease  . Abdominal hysterectomy    . Eye surgery      bilateral cataract surgery  . Hip pinning,cannulated Left 09/14/2012    Procedure: CANNULATED HIP PINNING;  Surgeon: [name], MD;  Location: [facility];  Service: Orthopedics;  Laterality: Left;  . Femur im nail Right 05/01/2013    Procedure: INTRAMEDULLARY (IM) NAIL FEMORAL;  Surgeon: Shelda Pal, MD;  Location: [facility];  Service: Orthopedics;  Laterality: Right;   Social History:   reports that she quit smoking about 20 years ago. She has never used smokeless tobacco. She reports that she does not drink alcohol or use illicit drugs.  Family History  Problem Relation Age of Onset  . Cancer Sister   . Cancer Mother   . Heart attack Father     Medications: Patient's Medications  New Prescriptions   No medications on file  Previous Medications   ACETAMINOPHEN (TYLENOL) 500 MG TABLET    Take 500 mg by mouth 3 (three) times daily.   BISACODYL (DULCOLAX) 10 MG SUPPOSITORY    Place 1 suppository (10 mg total) rectally daily as needed.   FEEDING SUPPLEMENT (RESOURCE BREEZE) LIQD    Take 1 Container by mouth 2 (two) times daily.   LORAZEPAM (ATIVAN) 0.5 MG TABLET    Take 0.5 mg by mouth every 4 (four) hours as needed for anxiety.   MORPHINE (ROXANOL) 20 MG/ML CONCENTRATED SOLUTION    Take 5 mg by mouth every 4 (four) hours as needed for severe pain.   NUTRITIONAL SUPPLEMENTS (BOOST PO)    Take 1 Can by mouth 2 (two) times daily.   POLYETHYLENE GLYCOL POWDER (GLYCOLAX/MIRALAX) POWDER    Take 17 g by mouth every other day.   Modified Medications   No medications on file  Discontinued Medications   CARBIDOPA-LEVODOPA (SINEMET IR) 25-100 MG PER TABLET    Take 0.5-1.5 tablets by mouth 3 (three) times daily. Pt takes 1.5 tablets with meals. At 3p  pt's caregiver gives her 1/2 tab -1 tablet depending on severity of tremors/Parkinson disease.   DONEPEZIL (ARICEPT) 10 MG TABLET    Take 10 mg by mouth at bedtime. Take 1 tablet once daily at bedtime.   FERROUS SULFATE 325 (65 FE) MG TABLET    Take 325 mg by mouth 2 (two) times daily.   HYDROCODONE-ACETAMINOPHEN (NORCO/VICODIN) 5-325 MG PER TABLET    Take 1-2 tablets by mouth every 6 (six) hours as needed.   MEMANTINE (NAMENDA) 10 MG TABLET    Take 10 mg by mouth 2 (two) times daily.   TRAMADOL-ACETAMINOPHEN (ULTRACET) 37.5-325 MG PER TABLET    Take 1 tablet by mouth every 6 (six) hours  as needed for pain.   VITAMIN D, ERGOCALCIFEROL, (DRISDOL) 50000 UNITS CAPS    Take 50,000 Units by mouth every 7 (seven) days. Monday- Take 1 by mouth weekly for vitamin deficiency.     Physical Exam  Constitutional: No distress.  Frail, elderly female.  HENT:  Head: Normocephalic and atraumatic.  Right Ear: External ear normal.  Left Ear: External ear normal.  Nose: Nose normal.  Mouth/Throat: Oropharynx is clear and moist.  Eyes: Conjunctivae and EOM are normal. Pupils are equal, round, and reactive to light.  Neck: No JVD present. No tracheal deviation present. No thyromegaly present.  Cardiovascular: Normal rate, regular rhythm, normal heart sounds and intact distal pulses.  Exam reveals no gallop and no friction rub.   No murmur heard. Pulmonary/Chest: No respiratory distress. She has no wheezes. She has no rales. She exhibits no tenderness.  Abdominal: She exhibits no distension and no mass. There is no tenderness.  Scaphoid abdomen.  Musculoskeletal: She exhibits no edema.  Right hip ORIF surgical scar from 04/2013. Old scar on the left hip from previous fracture March 2014.  Lymphadenopathy:    She has no cervical adenopathy.  Neurological: She is alert. No cranial nerve deficit. Coordination normal.  Demented. Responds to some questions.  Skin: No rash noted. No erythema. No pallor.    Surgical scars from hx of left nipple removal and abd hysterectomy. R+L hip ORIF surgical scars.   Psychiatric: She has a normal mood and affect. Her behavior is normal.  Flat affect and slow responses.     Filed Vitals:   08/23/13 1157  BP: 120/55  Pulse: 76  Temp: 98.4 F (36.9 C)  TempSrc: Tympanic  Resp: 16   Labs reviewed: Basic Metabolic Panel:  Recent Labs  81/19/1403/14/14 0423 10/11/12  04/30/13 1200 05/02/13 0444 05/03/13 0503 05/09/13  NA 137  --   < > 143 135 134* 144  K 3.9  --   < > 3.5 3.0* 3.7 3.6  CL 106  --   --  104 102 105  --   CO2 22  --   --  27 24 24   --   GLUCOSE 93  --   --  88 92 104*  --   BUN 9  --   < > 11 8 9 18   CREATININE 0.54  --   < > 0.60 0.45* 0.52 0.5  CALCIUM 8.1*  --   --  9.8 8.2* 8.1*  --   TSH  --  1.05  --   --   --   --   --   < > = values in this interval not displayed.  CBC:  Recent Labs  09/13/12 1924  04/30/13 1200 05/02/13 0444 05/02/13 1758 05/03/13 0503 05/09/13 06/13/13 08/10/13  WBC 14.3*  < > 10.6* 10.5 12.4* 11.1* 6.7 4.7 5.5  NEUTROABS 12.2*  --  8.5*  --   --   --   --   --   --   HGB 13.3  < > 14.4 9.5* 8.5* 8.4* 8.8* 11.0* 11.7*  HCT 37.4  < > 42.3 27.1* 25.1* 23.9* 26* 32* 34*  MCV 88.4  < > 95.3 93.4 93.3 93.4  --   --   --   PLT 203  < > 235 139* 140* 147* 430* 279 183  < > = values in this interval not displayed. Past Procedures:  10/07/12 US abd: no definite ultrasound abnormalities of the abdomen could be identified. No  gallstones in the gallbladder could be identified.   10/21/12 CXR increased interstitial findings lung fields bilaterally, which may be chronic in nature. Findings consistent with remote granulomatous disease. No evidence for congestive heart failure.   04/30/2013 CHEST - 1 VIEW IMPRESSION: No active disease.   04/30/2013 BILATERAL HIP WITH PELVIS - 4+ IMPRESSION: 1. Acute intertrochanteric fracture of the right hip. 2. No acute fracture is identified on the left with previously placed  screws demonstrating stable positioning. The left femoral neck is considerably foreshortened on the images due to rotation. If there remains any significant left hip pain, recommend repeat hip radiographs.   05/02/2013 DG OPERATIVE RIGHT HIP IMPRESSION: Internal fixation of intertrochanteric fracture.   Assessment/Plan Dementia with Lewy bodies Progressing rapidly in the past year, especially after her hip fx surgery 04/2013-will discontinue memory preserving med. Comfort care. Will have Ativan prn available to her.   Depression due to dementia Efficacy of Cymbalta is questionable-persisted flat affect, refusal of food and medications, continue weight loss-#7Ibs in the past 3 months, total dependent of her ADLs. Dc Cymbalta. Continue with comfort care.   Acute blood loss anemia Has been resolved, last Hgb 11s.   Osteoarthritis Hx of R+L hip fxs surgical repair. W/C dependent. Will have Morphine prn available to her to ensure comfort care.   Unspecified vitamin D deficiency Dc Vit D: R>B presently.   Unspecified constipation Continue with MiraLax qod and prn Bisacodyl.   Parkinson disease Refused her Sinemet for 2 days-deconditioning overall-total care for her ADLs-dc Sinemet  FTT (failure to thrive) in adult Simplify medications, comfort measures, supportive care.     Family/ Staff Communication: observe the patient.   Goals of Care: SNF, Hospice Service.   Labs/tests ordered: none

## 2013-08-23 NOTE — Assessment & Plan Note (Signed)
Has been resolved, last Hgb 11s.

## 2013-08-23 NOTE — Assessment & Plan Note (Signed)
Progressing rapidly in the past year, especially after her hip fx surgery 04/2013-will discontinue memory preserving med. Comfort care. Will have Ativan prn available to her.

## 2013-08-23 NOTE — Assessment & Plan Note (Signed)
Efficacy of Cymbalta is questionable-persisted flat affect, refusal of food and medications, continue weight loss-#7Ibs in the past 3 months, total dependent of her ADLs. Dc Cymbalta. Continue with comfort care.

## 2013-10-04 DEATH — deceased

## 2014-02-14 ENCOUNTER — Ambulatory Visit: Payer: Medicare Other | Admitting: Nurse Practitioner

## 2014-11-30 IMAGING — CR DG HIP W/ PELVIS BILAT
5 series · 5 of 5 positions shown · non-contrast
Comparison: 09/14/2012

CLINICAL DATA: Fall with bilateral hip pain. History prior left hip
fracture with pinning.

EXAM:
BILATERAL HIP WITH PELVIS - 4+ VIEW

[t pelvis ap]
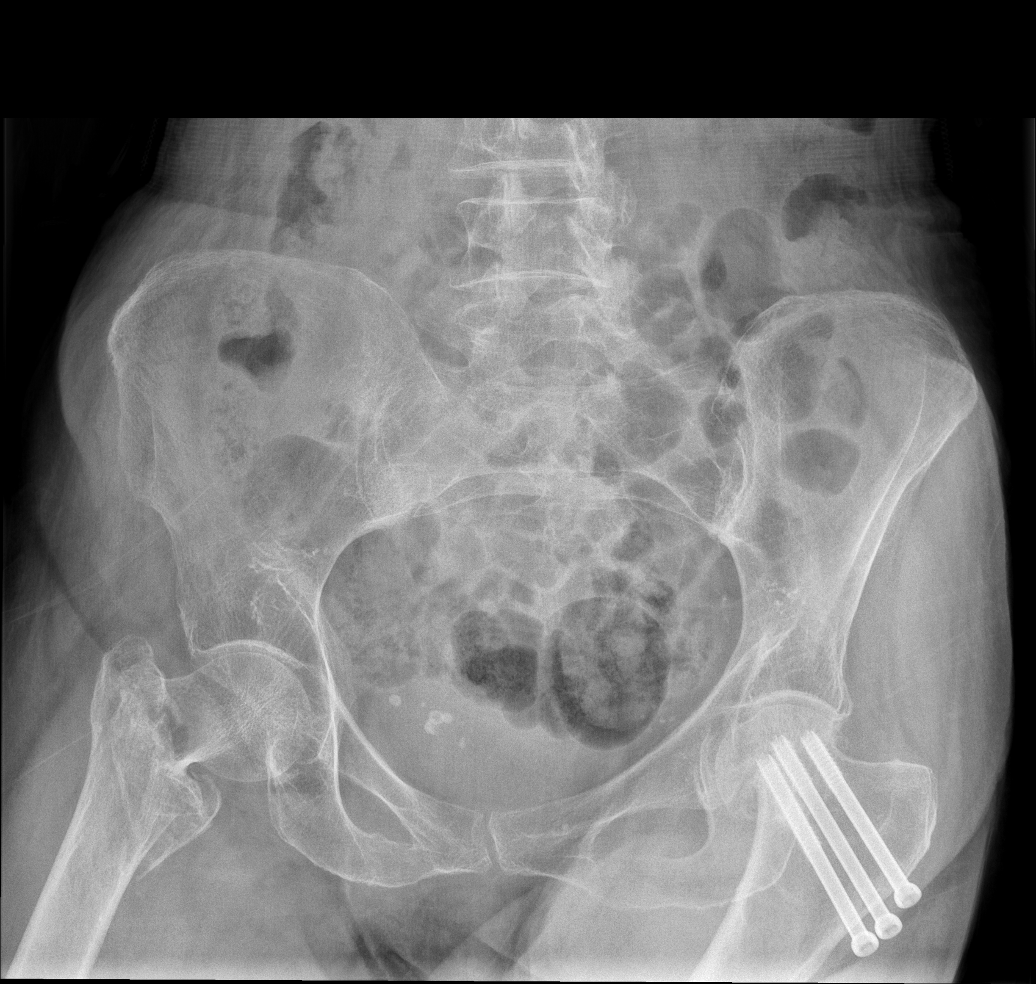

[t hip ap left]
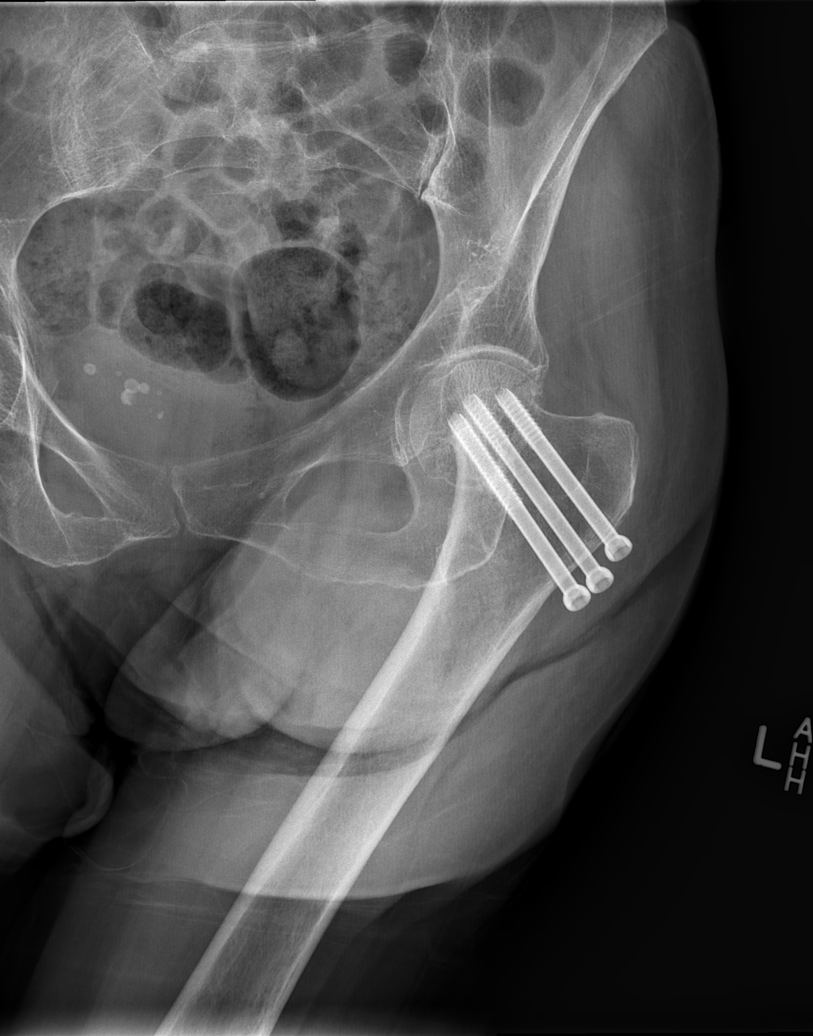

[t hip ap right]
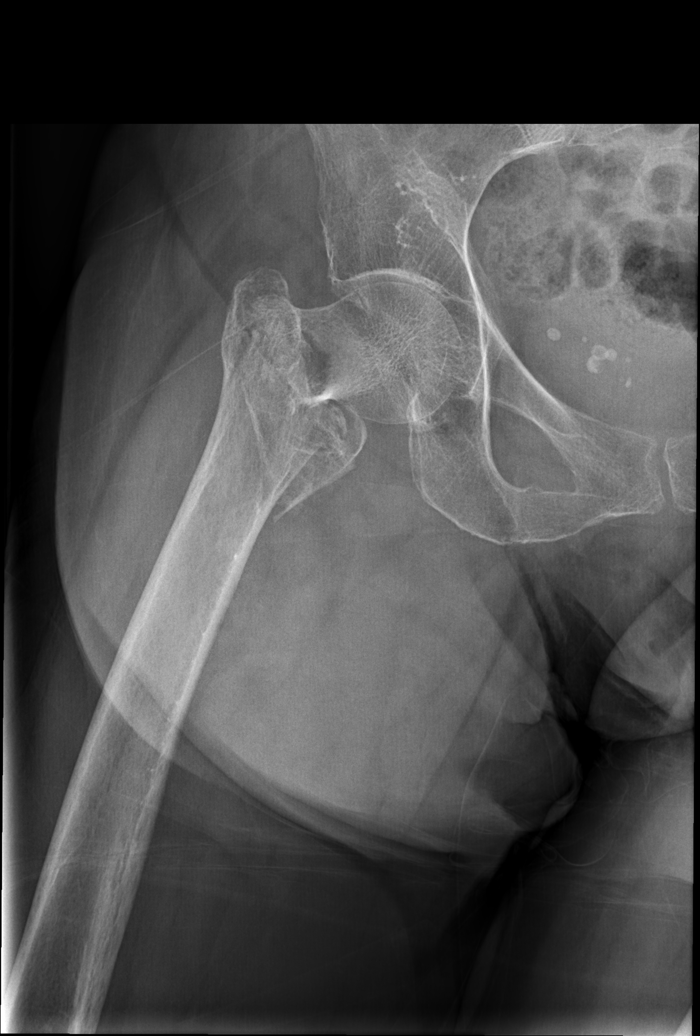

[w hip lat right]
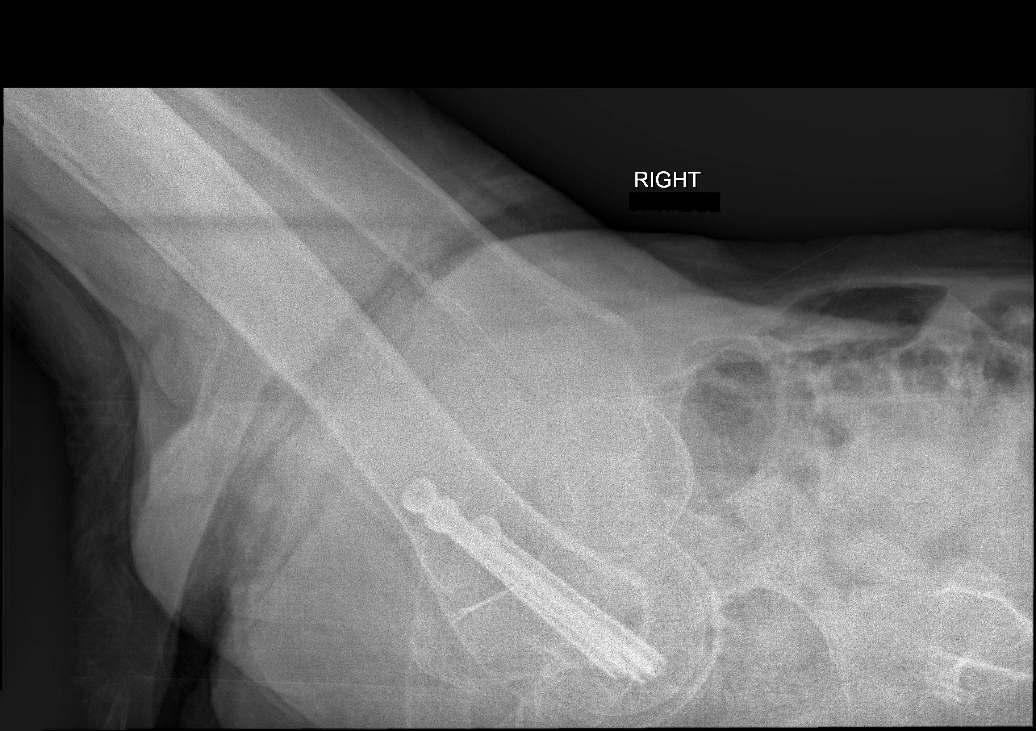

[w hip lat left]
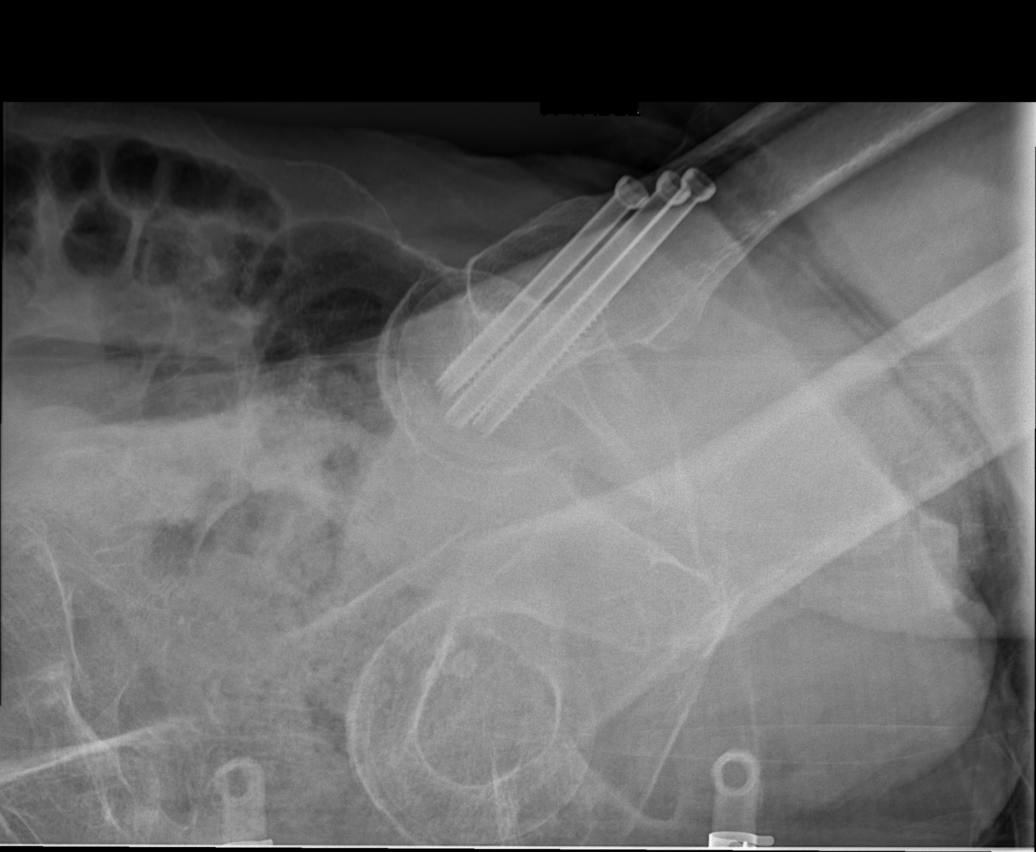

[5 of 5 positions shown; findings below may reference images not displayed]

FINDINGS: Acute intertrochanteric fracture of the right hip demonstrates
displacement. No evidence of dislocation. Screws across the left
femoral neck shows stable positioning and there is no evidence of
acute left hip fracture. The left femoral neck is very foreshortened
on the radiographs which is felt to primarily relate to leg
rotation. If there is any further remaining suspicion of potential
acute injury to left hip, repeat dedicated films would be
recommended in a better position. The bony pelvis is intact.
IMPRESSION: 1. Acute intertrochanteric fracture of the right hip.
2. No acute fracture is identified on the left with previously
placed screws demonstrating stable positioning. The left femoral
neck is considerably foreshortened on the images due to rotation. If
there remains any significant left hip pain, recommend repeat hip
radiographs.
